# Patient Record
Sex: Female | Born: 1985
Health system: Southern US, Community
[De-identification: ages and names within clinical notes are randomized; demographics above are authoritative.]

## PROBLEM LIST (undated history)

## (undated) DIAGNOSIS — F419 Anxiety disorder, unspecified: Secondary | ICD-10-CM

## (undated) DIAGNOSIS — I10 Essential (primary) hypertension: Secondary | ICD-10-CM

## (undated) DIAGNOSIS — A64 Unspecified sexually transmitted disease: Secondary | ICD-10-CM

## (undated) DIAGNOSIS — B009 Herpesviral infection, unspecified: Secondary | ICD-10-CM

## (undated) DIAGNOSIS — F32A Depression, unspecified: Secondary | ICD-10-CM

## (undated) DIAGNOSIS — J45909 Unspecified asthma, uncomplicated: Secondary | ICD-10-CM

## (undated) DIAGNOSIS — T7840XA Allergy, unspecified, initial encounter: Secondary | ICD-10-CM

## (undated) DIAGNOSIS — N879 Dysplasia of cervix uteri, unspecified: Secondary | ICD-10-CM

## (undated) DIAGNOSIS — F329 Major depressive disorder, single episode, unspecified: Secondary | ICD-10-CM

## (undated) HISTORY — DX: Dysplasia of cervix uteri, unspecified: N87.9

## (undated) HISTORY — PX: GYNECOLOGIC CRYOSURGERY: SHX857

## (undated) HISTORY — PX: COLPOSCOPY: SHX161

## (undated) HISTORY — PX: WISDOM TOOTH EXTRACTION: SHX21

## (undated) HISTORY — DX: Essential (primary) hypertension: I10

## (undated) HISTORY — DX: Anxiety disorder, unspecified: F41.9

## (undated) HISTORY — DX: Major depressive disorder, single episode, unspecified: F32.9

## (undated) HISTORY — DX: Unspecified sexually transmitted disease: A64

## (undated) HISTORY — DX: Unspecified asthma, uncomplicated: J45.909

## (undated) HISTORY — PX: OTHER SURGICAL HISTORY: SHX169

## (undated) HISTORY — PX: CERVICAL BIOPSY: SHX590

## (undated) HISTORY — DX: Depression, unspecified: F32.A

## (undated) HISTORY — DX: Allergy, unspecified, initial encounter: T78.40XA

---

## 2016-12-05 ENCOUNTER — Ambulatory Visit (INDEPENDENT_AMBULATORY_CARE_PROVIDER_SITE_OTHER): Payer: BLUE CROSS/BLUE SHIELD | Admitting: Physician Assistant

## 2016-12-05 VITALS — BP 150/84 | HR 91 | Temp 98.0°F | Ht 62.0 in | Wt 196.6 lb

## 2016-12-05 DIAGNOSIS — R030 Elevated blood-pressure reading, without diagnosis of hypertension: Secondary | ICD-10-CM

## 2016-12-05 DIAGNOSIS — Z720 Tobacco use: Secondary | ICD-10-CM | POA: Diagnosis not present

## 2016-12-05 DIAGNOSIS — Z716 Tobacco abuse counseling: Secondary | ICD-10-CM | POA: Diagnosis not present

## 2016-12-05 DIAGNOSIS — J45909 Unspecified asthma, uncomplicated: Secondary | ICD-10-CM

## 2016-12-05 NOTE — Progress Notes (Signed)
   Alexis Donaldson  MRN: [id number] DOB: 1986/01/24  Subjective:  Alexis Donaldson is a 31 y.o. female seen in office today for a chief complaint of need of cardiopulmonary specialist. She had a DUI and needs an ignition interlock placed in her car but she cannot pass the test to start her car due to difficulty breathing out. She needs a short breath test but can only get this if she has a specialist sign off on it. She has had asthma for the past 7 years. Does QVAR 41mcg daily and proair as needed. She smokes 1-2 cigarettes a day for the past 4 years. She has been trying to quit the past two months. She is not using anything to quit.     Review of Systems  Constitutional: Negative for chills, diaphoresis and fever.  Respiratory: Negative for cough, shortness of breath and wheezing.   Cardiovascular: Negative for chest pain and palpitations.  Gastrointestinal: Negative for abdominal pain, diarrhea, nausea and vomiting.   There are no active problems to display for this patient.   No current outpatient prescriptions on file prior to visit.   No current facility-administered medications on file prior to visit.     Allergies  Allergen Reactions  . Effexor [Venlafaxine] Anxiety       Social History   Social History  . Marital status: Single    Spouse name: N/A  . Number of children: N/A  . Years of education: N/A   Occupational History  . Not on file.   Social History Main Topics  . Smoking status: Light Tobacco Smoker    Packs/day: 0.25    Years: 4.00    Types: Cigarettes  . Smokeless tobacco: Never Used  . Alcohol use 4.8 oz/week    1 Cans of beer, 4 Glasses of wine, 3 Shots of liquor per week  . Drug use: No  . Sexual activity: Not on file   Other Topics Concern  . Not on file   Social History Narrative  . No narrative on file    Objective:  BP (!) 150/84 (BP Location: Left Arm, Patient Position: Sitting, Cuff Size: Large)   Pulse 91   Temp 98 F (36.7 C) (Oral)    Ht 5\' 2"  (1.575 m)   Wt 196 lb 9.6 oz (89.2 kg)   LMP 12/02/2016 (Exact Date)   SpO2 98%   BMI 35.96 kg/m   Physical Exam  Constitutional: She is oriented to person, place, and time and well-developed, well-nourished, and in no distress.  HENT:  Head: Normocephalic and atraumatic.  Eyes: Conjunctivae are normal.  Neck: Normal range of motion.  Cardiovascular: Normal rate, regular rhythm and normal heart sounds.   Pulmonary/Chest: Effort normal and breath sounds normal.  Neurological: She is alert and oriented to person, place, and time. Gait normal.  Skin: Skin is warm and dry.  Psychiatric: Affect normal.  Vitals reviewed.   Assessment and Plan :  1. Uncomplicated asthma, unspecified asthma severity, unspecified whether persistent - Ambulatory referral to Pulmonology  2. Elevated blood pressure reading -Asymptomatic. Instructed to check bp outside of office. Goal is <140/90. Please return to office in 1 week for bp recheck, consider initiating medications at this time if still elevated.   3. Encounter for smoking cessation counseling Pt given educational material on smoking cessation. Given dosing regimen for OTC nicoderm patch.   Tenna Delaine PA-C  [facility] 12/05/2016 4:30 PM

## 2016-12-05 NOTE — Patient Instructions (Addendum)
I have put in your referral for pulmonology. They should be contacting within the next week, if you do not hear anything from them in one week call our office.   For nicoderm: Buy the 14mg  patch and apply daily for 6 weeks. Then apply 7mg  patch daily for 2 weeks. Start this on cigarette quit day.   I would like you to follow up with me in 1-2 weeks for repeat blood pressure. If you could take your bp over the next week at different times of the day and write these values down. Take it after you have been sitting down for at least 5 minutes.   How to Take Your Blood Pressure You can take your blood pressure at home with a machine. You may need to check your blood pressure at home:  To check if you have high blood pressure (hypertension).  To check your blood pressure over time.  To make sure your blood pressure medicine is working. Supplies needed: You will need a blood pressure machine, or monitor. You can buy one at a drugstore or online. When choosing one:  Choose one with an arm cuff.  Choose one that wraps around your upper arm. Only one finger should fit between your arm and the cuff.  Do not choose one that measures your blood pressure from your wrist or finger. Your doctor can suggest a monitor. How to prepare Avoid these things for 30 minutes before checking your blood pressure:  Drinking caffeine.  Drinking alcohol.  Eating.  Smoking.  Exercising. Five minutes before checking your blood pressure:  Pee.  Sit in a dining chair. Avoid sitting in a soft couch or armchair.  Be quiet. Do not talk. How to take your blood pressure Follow the instructions that came with your machine. If you have a digital blood pressure monitor, these may be the instructions: 1. Sit up straight. 2. Place your feet on the floor. Do not cross your ankles or legs. 3. Rest your left arm at the level of your heart. You may rest it on a table, desk, or chair. 4. Pull up your shirt  sleeve. 5. Wrap the blood pressure cuff around the upper part of your left arm. The cuff should be 1 inch (2.5 cm) above your elbow. It is best to wrap the cuff around bare skin. 6. Fit the cuff snugly around your arm. You should be able to place only one finger between the cuff and your arm. 7. Put the cord inside the groove of your elbow. 8. Press the power button. 9. Sit quietly while the cuff fills with air and loses air. 10. Write down the numbers on the screen. 11. Wait 2-3 minutes and then repeat steps 1-10. What do the numbers mean? Two numbers make up your blood pressure. The first number is called systolic pressure. The second is called diastolic pressure. An example of a blood pressure reading is "120 over 80" (or 120/80). If you are an adult and do not have a medical condition, use this guide to find out if your blood pressure is normal: Normal   First number: below 120.  Second number: below 80. Elevated   First number: 120-129.  Second number: below 80. Hypertension stage 1   First number: 130-139.  Second number: 80-89. Hypertension stage 2   First number: 140 or above.  Second number: 24 or above. Your blood pressure is above normal even if only the top or bottom number is above normal. Follow these  instructions at home:  Check your blood pressure as often as your doctor tells you to.  Take your monitor to your next doctor's appointment. Your doctor will:  Make sure you are using it correctly.  Make sure it is working right.  Make sure you understand what your blood pressure numbers should be.  Tell your doctor if your medicines are causing side effects. Contact a doctor if:  Your blood pressure keeps being high. Get help right away if:  Your first blood pressure number is higher than 180.  Your second blood pressure number is higher than 120. This information is not intended to replace advice given to you by your health care provider. Make sure  you discuss any questions you have with your health care provider. Document Released: 09/04/2008 Document Revised: 08/20/2016 Document Reviewed: 02/29/2016 Elsevier Interactive Patient Education  2017 [employer].   IF you received an x-ray today, you will receive an invoice from [facility]. Please contact [facility] at [phone] with questions or concerns regarding your invoice.   IF you received labwork today, you will receive an invoice from [facility]. Please contact [facility] at [phone] with questions or concerns regarding your invoice.   Our billing staff will not be able to assist you with questions regarding bills from these companies.  You will be contacted with the lab results as soon as they are available. The fastest way to get your results is to activate your My Chart account. Instructions are located on the last page of this paperwork. If you have not heard from [place] regarding the results in 2 weeks, please contact this office.

## 2016-12-12 ENCOUNTER — Ambulatory Visit (INDEPENDENT_AMBULATORY_CARE_PROVIDER_SITE_OTHER): Payer: BLUE CROSS/BLUE SHIELD | Admitting: Physician Assistant

## 2016-12-12 VITALS — BP 150/90 | HR 84 | Temp 98.3°F | Resp 16 | Ht 62.0 in | Wt 196.0 lb

## 2016-12-12 DIAGNOSIS — R8299 Other abnormal findings in urine: Secondary | ICD-10-CM

## 2016-12-12 DIAGNOSIS — I10 Essential (primary) hypertension: Secondary | ICD-10-CM | POA: Diagnosis not present

## 2016-12-12 DIAGNOSIS — Z113 Encounter for screening for infections with a predominantly sexual mode of transmission: Secondary | ICD-10-CM | POA: Diagnosis not present

## 2016-12-12 DIAGNOSIS — R82998 Other abnormal findings in urine: Secondary | ICD-10-CM

## 2016-12-12 LAB — POCT URINALYSIS DIP (MANUAL ENTRY)
BILIRUBIN UA: NEGATIVE
Bilirubin, UA: NEGATIVE
Blood, UA: NEGATIVE
GLUCOSE UA: NEGATIVE
Nitrite, UA: NEGATIVE
Spec Grav, UA: 1.015
Urobilinogen, UA: 1
pH, UA: 6.5

## 2016-12-12 LAB — POC MICROSCOPIC URINALYSIS (UMFC): Mucus: ABSENT

## 2016-12-12 MED ORDER — BLOOD PRESSURE KIT DEVI: 1.0000 [IU] | Freq: Every day | 0 refills | Status: AC

## 2016-12-12 MED ORDER — AMLODIPINE BESYLATE 5 MG PO TABS: 5.0000 mg | ORAL_TABLET | Freq: Every day | ORAL | 0 refills | Status: DC

## 2016-12-12 NOTE — Progress Notes (Signed)
MRN: [id number] DOB: 1986-05-03  Subjective:   Alexis Donaldson is a 31 y.o. female presenting for follow up on high blood pressure readings. Was followed [facility] [place] for the past two years for consistent elevated bp readings. She never followed up for medication initiation. She used to check in during this time out of the office and it was run anywhere from [id number] systoilic.   I saw her last week for a referrral visit ant iwas elevated told to return in on eweek for bp repeat. she has not checked it outside the office. Reports no symptoms. Denies lightheadedness, dizziness, chronic headache, double vision, chest pain, shortness of breath, heart racing, palpitations, nausea, vomiting, abdominal pain, hematuria, lower leg swelling. Recently stopped smoking. She has not smoked in one week. Prior to this she smoked 1/4 pack ppd x 4 years.   Diet: [place], chicken, fish, some beef,. Does like chips and sunflower seeds but working on cutting these out. Does not cook with a lot of extra salt. She loves fruits and vegetables. Does drink juice but is trying to up her water intake.   Exercise: Her exercise has decreased over the past few months she used to walk 2 miles daily.   FH of HTN in mother and father. Father had MI at age 10 and 20 yo.   She also wants STD testing today. Not having symptoms but just recently switched sexual partners. She does not use condoms. Has IUD in place, does not have periods.   Alexis Donaldson has a current medication list which includes the following prescription(s): albuterol, beclomethasone, cetirizine, fluticasone, ibuprofen, amlodipine, and blood pressure kit. Also is allergic to effexor [venlafaxine].  Alexis Donaldson  has a past medical history of Allergy; Anxiety; Asthma; and Depression. Also  has no past surgical history on file.   Objective:   Vitals: BP (!) 150/90   Pulse 84   Temp 98.3 F (36.8 C) (Oral)   Resp 16   Ht '5\' 2"'  (1.575 m)   Wt 196 lb (88.9 kg)   LMP  12/02/2016 (Exact Date)   SpO2 98%   BMI 35.85 kg/m   Physical Exam  Constitutional: She is oriented to person, place, and time. She appears well-developed and well-nourished.  HENT:  Head: Normocephalic and atraumatic.  Eyes: Conjunctivae are normal.  Neck: Normal range of motion.  Cardiovascular: Normal rate, regular rhythm, normal heart sounds and intact distal pulses.   Pulmonary/Chest: Effort normal and breath sounds normal.  Abdominal: Soft. Bowel sounds are normal. There is no tenderness.  Neurological: She is alert and oriented to person, place, and time.  Skin: Skin is warm and dry.  Psychiatric: She has a normal mood and affect.  Vitals reviewed.   BP Readings from Last 3 Encounters:  12/12/16 (!) 150/90  12/05/16 (!) 150/84    Results for orders placed or performed in visit on 12/12/16 (from the past 24 hour(s))  POCT urinalysis dipstick     Status: Abnormal   Collection Time: 12/12/16 10:28 AM  Result Value Ref Range   Color, UA yellow yellow   Clarity, UA clear clear   Glucose, UA negative negative   Bilirubin, UA negative negative   Ketones, POC UA negative negative   Spec Grav, UA 1.015    Blood, UA negative negative   pH, UA 6.5    Protein Ur, POC =30 (A) negative   Urobilinogen, UA 1.0    Nitrite, UA Negative Negative   Leukocytes, UA Trace (A) Negative  POCT Microscopic Urinalysis ([facility])     Status: Abnormal   Collection Time: 12/12/16 10:29 AM  Result Value Ref Range   WBC,UR,HPF,POC Few (A) None WBC/hpf   RBC,UR,HPF,POC None None RBC/hpf   Bacteria Moderate (A) None, Too numerous to count   Mucus Absent Absent   Epithelial Cells, UR Per Microscopy Moderate (A) None, Too numerous to count cells/hpf     EKG shows sinus rhythm at 92 bpm. There is nonspecific T wave flattening in lead III and nonspecific T wave inversion in lead V1. No acute ST or T wave changes. Normal axis. No prior EKG for comparison.   Assessment and Plan :  1. Essential  hypertension Pt instructed to return for follow up in 4-6 weeks for bp recheck. Monitor outside office, goal is <140/90. - CBC with Differential/Platelet - CMP14+EGFR - Lipid panel - TSH - POCT urinalysis dipstick - POCT Microscopic Urinalysis ([facility]) - EKG 12-Lead - amLODipine (NORVASC) 5 MG tablet; Take 1 tablet (5 mg total) by mouth daily.  Dispense: 90 tablet; Refill: 0 - Blood Pressure Monitoring (BLOOD PRESSURE KIT) DEVI; 1 Units by Does not apply route daily.  Dispense: 1 Device; Refill: 0  2. Screen for STD (sexually transmitted disease) - GC/Chlamydia Probe Amp - HIV antibody - Hepatitis C antibody - RPR - Trichomonas vaginalis, RNA  3. Leukocytes in urine -Asymptomatic.  - Urine culture  Tenna Delaine, PA-C  [facility] 12/12/2016 12:26 PM

## 2016-12-12 NOTE — Patient Instructions (Addendum)
For high blood pressure, I want you to start taking amlodipine 5mg  daily. I have given you a prescription for a blood pressure monitoring cuff. Please check your bp every few days. Your goal is <140/90 and >100/60. Follow up in 4-6 weeks for bp recheck. Bring your readings to this visit. I will contact you with your lab results in a few days.   You can make your follow up appointment today when you leave.   Thank you for letting me participate in your health and well being.    Hypertension Hypertension is another name for high blood pressure. High blood pressure forces your heart to work harder to pump blood. This can cause problems over time. There are two numbers in a blood pressure reading. There is a top number (systolic) over a bottom number (diastolic). It is best to have a blood pressure below 120/80. Healthy choices can help lower your blood pressure. You may need medicine to help lower your blood pressure if:  Your blood pressure cannot be lowered with healthy choices.  Your blood pressure is higher than 130/80. Follow these instructions at home: Eating and drinking   If directed, follow the DASH eating plan. This diet includes:  Filling half of your plate at each meal with fruits and vegetables.  Filling one quarter of your plate at each meal with whole grains. Whole grains include whole wheat pasta, brown rice, and whole grain bread.  Eating or drinking low-fat dairy products, such as skim milk or low-fat yogurt.  Filling one quarter of your plate at each meal with low-fat (lean) proteins. Low-fat proteins include fish, skinless chicken, eggs, beans, and tofu.  Avoiding fatty meat, cured and processed meat, or chicken with skin.  Avoiding premade or processed food.  Eat less than 1,500 mg of salt (sodium) a day.  Limit alcohol use to no more than 1 drink a day for nonpregnant women and 2 drinks a day for men. One drink equals 12 oz of beer, 5 oz of wine, or 1 oz of hard  liquor. Lifestyle   Work with your doctor to stay at a healthy weight or to lose weight. Ask your doctor what the best weight is for you.  Get at least 30 minutes of exercise that causes your heart to beat faster (aerobic exercise) most days of the week. This may include walking, swimming, or biking.  Get at least 30 minutes of exercise that strengthens your muscles (resistance exercise) at least 3 days a week. This may include lifting weights or pilates.  Do not use any products that contain nicotine or tobacco. This includes cigarettes and e-cigarettes. If you need help quitting, ask your doctor.  Check your blood pressure at home as told by your doctor.  Keep all follow-up visits as told by your doctor. This is important. Medicines   Take over-the-counter and prescription medicines only as told by your doctor. Follow directions carefully.  Do not skip doses of blood pressure medicine. The medicine does not work as well if you skip doses. Skipping doses also puts you at risk for problems.  Ask your doctor about side effects or reactions to medicines that you should watch for. Contact a doctor if:  You think you are having a reaction to the medicine you are taking.  You have headaches that keep coming back (recurring).  You feel dizzy.  You have swelling in your ankles.  You have trouble with your vision. Get help right away if:  You get  a very bad headache.  You start to feel confused.  You feel weak or numb.  You feel faint.  You get very bad pain in your:  Chest.  Belly (abdomen).  You throw up (vomit) more than once.  You have trouble breathing. Summary  Hypertension is another name for high blood pressure.  Making healthy choices can help lower blood pressure. If your blood pressure cannot be controlled with healthy choices, you may need to take medicine. This information is not intended to replace advice given to you by your health care provider. Make  sure you discuss any questions you have with your health care provider. Document Released: 03/10/2008 Document Revised: 08/20/2016 Document Reviewed: 08/20/2016 Elsevier Interactive Patient Education  2017 [employer].     IF you received an x-ray today, you will receive an invoice from [facility]. Please contact [facility] at [phone] with questions or concerns regarding your invoice.   IF you received labwork today, you will receive an invoice from [facility]. Please contact [facility] at [phone] with questions or concerns regarding your invoice.   Our billing staff will not be able to assist you with questions regarding bills from these companies.  You will be contacted with the lab results as soon as they are available. The fastest way to get your results is to activate your My Chart account. Instructions are located on the last page of this paperwork. If you have not heard from [place] regarding the results in 2 weeks, please contact this office.Pt

## 2016-12-13 LAB — HIV ANTIBODY (ROUTINE TESTING W REFLEX): HIV SCREEN 4TH GENERATION: NONREACTIVE

## 2016-12-13 LAB — CBC WITH DIFFERENTIAL/PLATELET
Basophils Absolute: 0 10*3/uL (ref 0.0–0.2)
Basos: 0 %
EOS (ABSOLUTE): 0.1 10*3/uL (ref 0.0–0.4)
EOS: 1 %
HEMATOCRIT: 38.8 % (ref 34.0–46.6)
HEMOGLOBIN: 13.2 g/dL (ref 11.1–15.9)
Immature Grans (Abs): 0 10*3/uL (ref 0.0–0.1)
Immature Granulocytes: 0 %
LYMPHS ABS: 1.8 10*3/uL (ref 0.7–3.1)
Lymphs: 38 %
MCH: 28.4 pg (ref 26.6–33.0)
MCHC: 34 g/dL (ref 31.5–35.7)
MCV: 83 fL (ref 79–97)
MONOCYTES: 10 %
Monocytes Absolute: 0.5 10*3/uL (ref 0.1–0.9)
NEUTROS ABS: 2.4 10*3/uL (ref 1.4–7.0)
Neutrophils: 51 %
Platelets: 311 10*3/uL (ref 150–379)
RBC: 4.65 x10E6/uL (ref 3.77–5.28)
RDW: 13.4 % (ref 12.3–15.4)
WBC: 4.9 10*3/uL (ref 3.4–10.8)

## 2016-12-13 LAB — CMP14+EGFR
ALBUMIN: 4.5 g/dL (ref 3.5–5.5)
ALK PHOS: 78 IU/L (ref 39–117)
ALT: 28 IU/L (ref 0–32)
AST: 28 IU/L (ref 0–40)
Albumin/Globulin Ratio: 1.3 (ref 1.2–2.2)
BILIRUBIN TOTAL: 0.3 mg/dL (ref 0.0–1.2)
BUN/Creatinine Ratio: 4 — ABNORMAL LOW (ref 9–23)
BUN: 3 mg/dL — ABNORMAL LOW (ref 6–20)
CHLORIDE: 101 mmol/L (ref 96–106)
CO2: 21 mmol/L (ref 18–29)
CREATININE: 0.75 mg/dL (ref 0.57–1.00)
Calcium: 9.6 mg/dL (ref 8.7–10.2)
GFR calc Af Amer: 124 mL/min/{1.73_m2} (ref >59)
GFR calc non Af Amer: 107 mL/min/{1.73_m2} (ref >59)
GLOBULIN, TOTAL: 3.5 g/dL (ref 1.5–4.5)
Glucose: 93 mg/dL (ref 65–99)
POTASSIUM: 4 mmol/L (ref 3.5–5.2)
SODIUM: 143 mmol/L (ref 134–144)
Total Protein: 8 g/dL (ref 6.0–8.5)

## 2016-12-13 LAB — LIPID PANEL
CHOLESTEROL TOTAL: 226 mg/dL — AB (ref 100–199)
Chol/HDL Ratio: 6.3 ratio units — ABNORMAL HIGH (ref 0.0–4.4)
HDL: 36 mg/dL — ABNORMAL LOW (ref >39)
LDL CALC: 163 mg/dL — AB (ref 0–99)
Triglycerides: 133 mg/dL (ref 0–149)
VLDL Cholesterol Cal: 27 mg/dL (ref 5–40)

## 2016-12-13 LAB — RPR: RPR Ser Ql: NONREACTIVE

## 2016-12-13 LAB — TSH: TSH: 1.2 u[IU]/mL (ref 0.450–4.500)

## 2016-12-13 LAB — HEPATITIS C ANTIBODY: Hep C Virus Ab: <0.1 s/co ratio (ref 0.0–0.9)

## 2016-12-14 LAB — GC/CHLAMYDIA PROBE AMP
Chlamydia trachomatis, NAA: NEGATIVE
Neisseria gonorrhoeae by PCR: NEGATIVE

## 2016-12-14 LAB — TRICHOMONAS VAGINALIS, PROBE AMP: Trich vag by NAA: NEGATIVE

## 2016-12-14 LAB — URINE CULTURE: Organism ID, Bacteria: NO GROWTH

## 2016-12-29 ENCOUNTER — Ambulatory Visit (INDEPENDENT_AMBULATORY_CARE_PROVIDER_SITE_OTHER): Payer: BLUE CROSS/BLUE SHIELD | Admitting: Pulmonary Disease

## 2016-12-29 ENCOUNTER — Encounter: Payer: Self-pay | Admitting: Pulmonary Disease

## 2016-12-29 VITALS — BP 128/74 | HR 101 | Ht 62.0 in | Wt 192.0 lb

## 2016-12-29 DIAGNOSIS — R0602 Shortness of breath: Secondary | ICD-10-CM

## 2016-12-29 NOTE — Progress Notes (Signed)
Alexis Donaldson    [id number]    12/04/1985  Primary Care Physician:Brittany D Ellery Plunk  Referring Physician: Leonie Douglas, PA-C [place], [place] [address]  Chief complaint:  Consult for evaluation of asthma Requires spirometry for ignition interlock medical accommodation  HPI: 31 year old with past medical history of mild intermittent asthma. She has been maintained on Qvar and albuterol. She does not have daily symptoms and use the Qvar only intermittently. Her last use was over a month ago. She does not need to use albuterol inhaler at all. She has an ignition interlock system for a DUI and requires spirometry as she isn't able to blow to unlock the system.  Outpatient Encounter Prescriptions as of 12/29/2016  Medication Sig  . albuterol (PROVENTIL) (2.5 MG/3ML) 0.083% nebulizer solution Take 2.5 mg by nebulization every 6 (six) hours as needed for wheezing or shortness of breath.  Marland Kitchen amLODipine (NORVASC) 5 MG tablet Take 1 tablet (5 mg total) by mouth daily.  . Blood Pressure Monitoring (BLOOD PRESSURE KIT) DEVI 1 Units by Does not apply route daily.  Marland Kitchen ibuprofen (ADVIL,MOTRIN) 800 MG tablet Take 800 mg by mouth every 6 (six) hours as needed (as needed for pain).  Marland Kitchen levonorgestrel (MIRENA) 20 MCG/24HR IUD by Intrauterine route.  . beclomethasone (QVAR) 40 MCG/ACT inhaler Inhale into the lungs 2 (two) times daily.  . cetirizine (ZYRTEC) 10 MG tablet Take 10 mg by mouth daily.  . fluticasone (FLONASE) 50 MCG/ACT nasal spray Place into both nostrils daily.   No facility-administered encounter medications on file as of 12/29/2016.     Allergies as of 12/29/2016 - Review Complete 12/29/2016  Allergen Reaction Noted  . Effexor [venlafaxine] Anxiety 12/05/2016    Past Medical History:  Diagnosis Date  . Allergy   . Anxiety   . Asthma   . Depression     History reviewed. No pertinent surgical history.  Family History  Problem Relation Age of Onset   . Hyperlipidemia Mother   . Hypertension Mother   . Hypertension Father   . Heart attack Father   . Mental illness Sister   . Diabetes Maternal Grandmother   . Cancer Maternal Grandmother   . Cancer Maternal Grandfather   . Hypertension Maternal Grandfather   . Heart disease Maternal Grandfather   . Mental illness Paternal Grandmother   . Hypertension Paternal Grandfather   . Stroke Paternal Grandfather   . Mental illness Sister     Social History   Social History  . Marital status: Single    Spouse name: N/A  . Number of children: N/A  . Years of education: N/A   Occupational History  . Not on file.   Social History Main Topics  . Smoking status: Former Smoker    Packs/day: 0.25    Years: 4.00    Types: Cigarettes    Quit date: 12/04/2016  . Smokeless tobacco: Never Used  . Alcohol use 4.8 oz/week    4 Glasses of wine, 1 Cans of beer, 3 Shots of liquor per week  . Drug use: No  . Sexual activity: Not on file   Other Topics Concern  . Not on file   Social History Narrative  . No narrative on file    Review of systems: Review of Systems  Constitutional: Negative for fever and chills.  HENT: Negative.   Eyes: Negative for blurred vision.  Respiratory: as per HPI  Cardiovascular: Negative for chest pain and  palpitations.  Gastrointestinal: Negative for vomiting, diarrhea, blood per rectum. Genitourinary: Negative for dysuria, urgency, frequency and hematuria.  Musculoskeletal: Negative for myalgias, back pain and joint pain.  Skin: Negative for itching and rash.  Neurological: Negative for dizziness, tremors, focal weakness, seizures and loss of consciousness.  Endo/Heme/Allergies: Negative for environmental allergies.  Psychiatric/Behavioral: Negative for depression, suicidal ideas and hallucinations.  All other systems reviewed and are negative.  Physical Exam: Blood pressure 128/74, pulse (!) 101, height '5\' 2"'  (1.575 m), weight 192 lb (87.1 kg), last  menstrual period 12/02/2016, SpO2 98 %. Gen:      No acute distress HEENT:  EOMI, sclera anicteric Neck:     No masses; no thyromegaly Lungs:    Clear to auscultation bilaterally; normal respiratory effort CV:         Regular rate and rhythm; no murmurs Abd:      + bowel sounds; soft, non-tender; no palpable masses, no distension Ext:    No edema; adequate peripheral perfusion Skin:      Warm and dry; no rash Neuro: alert and oriented x 3 Psych: normal mood and affect  Data Reviewed: FENO 12/30/15- 9  PFTs 12/29/16 FVC 2.98 [100%) FEV1 2.54 (99%) F/F 85 Normal spirometry.  CBC    Component Value Date/Time   WBC 4.9 12/12/2016 0952   RBC 4.65 12/12/2016 0952   HCT 38.8 12/12/2016 0952   PLT 311 12/12/2016 0952   MCV 83 12/12/2016 0952   MCH 28.4 12/12/2016 0952   MCHC 34.0 12/12/2016 0952   RDW 13.4 12/12/2016 0952   LYMPHSABS 1.8 12/12/2016 0952   EOSABS 0.1 12/12/2016 0952   BASOSABS 0.0 12/12/2016 0952   Assessment:  Mild intermittent asthma She is using her q var inhaler as needed and does not need the albuterol inhaler at all. As her symptoms are minimal she can follow with her PCP for further management.  Spirometry reviewed which shows normal lung function. I have filled the DMV forms (scanned into media folder) and enclosed a copy of the spirometry.  Plan/Recommendations: - Return as needed - DMV paperwork filled.   Marshell Garfinkel MD [facility] Pager [phone] 12/29/2016, 4:43 PM  CC: Tenna Delaine D, PA*

## 2016-12-29 NOTE — Patient Instructions (Signed)
Your pulmonary function tests were reviewed today. We have signed and returned the paperwork You can follow up with primary care for further management of asthma. Return to clinic as needed.

## 2017-01-13 ENCOUNTER — Institutional Professional Consult (permissible substitution): Payer: Self-pay | Admitting: Pulmonary Disease

## 2017-01-14 ENCOUNTER — Encounter: Payer: Self-pay | Admitting: Physician Assistant

## 2017-01-14 ENCOUNTER — Ambulatory Visit (INDEPENDENT_AMBULATORY_CARE_PROVIDER_SITE_OTHER): Payer: BLUE CROSS/BLUE SHIELD | Admitting: Physician Assistant

## 2017-01-14 VITALS — BP 128/81 | HR 94 | Temp 99.0°F | Resp 16 | Ht 62.0 in | Wt 197.0 lb

## 2017-01-14 DIAGNOSIS — F411 Generalized anxiety disorder: Secondary | ICD-10-CM

## 2017-01-14 DIAGNOSIS — I10 Essential (primary) hypertension: Secondary | ICD-10-CM

## 2017-01-14 MED ORDER — ALBUTEROL SULFATE HFA 108 (90 BASE) MCG/ACT IN AERS: 2.0000 | INHALATION_SPRAY | RESPIRATORY_TRACT | 1 refills | Status: DC | PRN

## 2017-01-14 MED ORDER — AMLODIPINE BESYLATE 5 MG PO TABS: 5.0000 mg | ORAL_TABLET | Freq: Every day | ORAL | 0 refills | Status: DC

## 2017-01-14 MED ORDER — HYDROXYZINE HCL 25 MG PO TABS: 25.0000 mg | ORAL_TABLET | Freq: Three times a day (TID) | ORAL | 2 refills | Status: DC | PRN

## 2017-01-14 MED ORDER — IBUPROFEN 800 MG PO TABS: 800.0000 mg | ORAL_TABLET | Freq: Four times a day (QID) | ORAL | 0 refills | Status: DC | PRN

## 2017-01-14 NOTE — Progress Notes (Signed)
MRN: [id number] DOB: 01-09-1986  Subjective:   Alexis Donaldson is a 31 y.o. female presenting for follow up on Hypertension. Was started on amlodipine 32m at our last visit on 12/12/16.   Patient is checking blood pressure at home, range is 1941-740Csystolic. Reports she has had some mild swelling in her hands since she started the medication. Denies lightheadedness, dizziness, chronic headache, double vision, chest pain, shortness of breath, heart racing, palpitations, nausea, vomiting, abdominal pain, hematuria, lower leg swelling.   She would also like to talk about anxiety. She has tried zoloft and effexor in the past. Effexor caused terrible insomnia. Zoloft caused visual disturbances and heart palpitations. She has struggled with anxiety for the past 2 years. It is worsened with work and random things that she cannot pinpoint. 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 Her "bad days" consist of her waking up from sleep in sweats with nausea and tachycardia. After about an hour she will feel like she is back to normal. She will also have panick attacks at work when she has a lot to do, she will start feeling nauseated, increased heart rate, and shivers. Notes these attacks occur about once a week. She has tried her sister's hydroxyzine, which has helped. She has also tried tramadol and xanax, which have also helped. She was also dx with depression 2 years ago but notes that this is not her biggest issue. She deals with this with a sun lamp at home and exercise.   She also needs refills for her PRN ibuprofen and albuterol inhaler.   KDustinahas a current medication list which includes the following prescription(s): albuterol, amlodipine, beclomethasone, cetirizine, fluticasone, ibuprofen, blood pressure kit, and levonorgestrel. Also is allergic to effexor [venlafaxine] and zoloft [sertraline hcl].  KYanissa has a past medical history of Allergy; Anxiety;  Asthma; and Depression. Also  has no past surgical history on file.   Objective:   Vitals: BP 128/81 (BP Location: Right Arm, Patient Position: Sitting, Cuff Size: Normal)   Pulse 94   Temp 99 F (37.2 C) (Oral)   Resp 16   Ht 5' 2" (1.575 m)   Wt 197 lb (89.4 kg)   SpO2 97%   BMI 36.03 kg/m   Physical Exam  Constitutional: She is oriented to person, place, and time. She appears well-developed and well-nourished.  HENT:  Head: Normocephalic and atraumatic.  Eyes: Conjunctivae are normal.  Neck: Normal range of motion.  Cardiovascular: Normal rate, regular rhythm, normal heart sounds and intact distal pulses.   Pulmonary/Chest: Effort normal and breath sounds normal.  Musculoskeletal:       Right lower leg: She exhibits no swelling.       Left lower leg: She exhibits no swelling.  Neurological: She is alert and oriented to person, place, and time.  Skin: Skin is warm and dry.  Psychiatric: She has a normal mood and affect.  Vitals reviewed.   BP Readings from Last 3 Encounters:  01/14/17 128/81  12/29/16 128/74  12/12/16 (!) 150/90   No results found for this or any previous visit (from the past 24 hour(s)).  Assessment and Plan :  1. Essential hypertension Well controlled in office. Continue medications as prescribed. Discussed healthy lifestyle modifications to help lower bp. She has also quit smoking since our first visit, which I have congratulated her on and encouraged her to continue avoiding smoking. Instructed to return for reevaluation in 3 months.  -  amLODipine (NORVASC) 5 MG tablet; Take 1 tablet (5 mg total) by mouth daily.  Dispense: 90 tablet; Refill: 0  2. Anxiety state Pt does not want to start a long term medication at this point, she prefers using a short term PRN med as these attacks occur about once a week. Instructed to use vistaril PRN for panic attacks. Will reevaluate in 3 months at follow up visit.  - hydrOXYzine (ATARAX/VISTARIL) 25 MG tablet;  Take 1-2 tablets (25-50 mg total) by mouth 3 (three) times daily as needed.  Dispense: 30 tablet; Refill: Adeline, PA-C  [facility] 01/14/2017 2:31 PM

## 2017-01-14 NOTE — Patient Instructions (Addendum)
  For blood pressure, continue taking amlodipine daily. You are doing a great job and I am really proud of you. Keep up the good work in terms of exercising and dietary changes as well!   In terms of anxiety, I have prescribed hydroxyzine. You can take 1-2 tablets as needed up to three times a day when you are having an attack. Please use as soon as the anxiety comes about. This medication may make you drowsy so if it does and you need it during the daytime, try taking 1/2 tablet to decrease drowsiness.  Please follow up in 3 months or sooner if you have any questions!  Thank you for letting me participate in your health and well being.    IF you received an x-ray today, you will receive an invoice from [facility]. Please contact [facility] at [phone] with questions or concerns regarding your invoice.   IF you received labwork today, you will receive an invoice from [facility]. Please contact [facility] at [phone] with questions or concerns regarding your invoice.   Our billing staff will not be able to assist you with questions regarding bills from these companies.  You will be contacted with the lab results as soon as they are available. The fastest way to get your results is to activate your My Chart account. Instructions are located on the last page of this paperwork. If you have not heard from [place] regarding the results in 2 weeks, please contact this office.

## 2017-01-15 ENCOUNTER — Encounter: Payer: Self-pay | Admitting: Physician Assistant

## 2017-01-19 ENCOUNTER — Ambulatory Visit (INDEPENDENT_AMBULATORY_CARE_PROVIDER_SITE_OTHER): Payer: BLUE CROSS/BLUE SHIELD | Admitting: Internal Medicine

## 2017-01-19 ENCOUNTER — Encounter: Payer: Self-pay | Admitting: Internal Medicine

## 2017-01-19 DIAGNOSIS — J4530 Mild persistent asthma, uncomplicated: Secondary | ICD-10-CM | POA: Diagnosis not present

## 2017-01-19 DIAGNOSIS — Z9189 Other specified personal risk factors, not elsewhere classified: Secondary | ICD-10-CM

## 2017-01-19 DIAGNOSIS — Z8709 Personal history of other diseases of the respiratory system: Secondary | ICD-10-CM | POA: Diagnosis not present

## 2017-01-19 NOTE — Assessment & Plan Note (Signed)
This is well controlled based on symptoms and breathing test  Plan Continue qvar daily Use albuterol as needed Monitor for flareups FLu shot in fall   followup Return as needed PCP Leonie Douglas, PA-C should be able to handle your asthma but you can come back here if needed

## 2017-01-19 NOTE — Progress Notes (Signed)
Subjective:    Patient ID: Alexis Donaldson, female    DOB: [birth year], 31 y.o.   MRN: [id number]  HPI   OV 01/19/2017  Chief Complaint  Patient presents with  . Pulmonary Consult    Pt self referred for SOB. Pt states her SOB is worse with seasonal allergies. Pt c/o intermitent chest tightness d/t pollen. Pt denies cough and f/c/s.      Second opinion for mild intermittent asthma and driving safety issue  She has mild persistent asthma for which she is on Qvar. This was diagnosed outside for a few to several years. She was asthma as stable on Qvar for which she is compliant. On asthma control question and she does not wake up in the middle of the night because of asthma when she wakes up she is very mild symptoms. She is only very slightly limited because of asthma activities. She does experience a little shortness of breath because of asthma is a little of the time and uses albuterol for rescue 1 puff as needed on most days. The asthma control question average score is 1.2. A few weeks ago she did see me or my colleagues for the asthma and her spirometry at that time and nitric oxide at the time are normal suggesting that she has got good lung function in the asthma is under excellent control.  According to her history she had a DUI in August 2016 and her cough is now being fitted with a device interlock device. However she denies unable to blow into this and therefore she saw my colleague Dr. Vaughan Browner and was cleared to use the device because lung function was normal. She's here for second opinion which she says that  DMVV requires. Spirometry today is normal    has a past medical history of Allergy; Anxiety; Asthma; Depression; and Hypertension.   reports that she quit smoking about 6 weeks ago. Her smoking use included Cigarettes. She has a 1.00 pack-year smoking history. She has never used smokeless tobacco.  Past Surgical History:  Procedure Laterality Date  . CERVICAL BIOPSY    .  WISDOM TOOTH EXTRACTION      Allergies  Allergen Reactions  . Effexor [Venlafaxine] Anxiety  . Zoloft [Sertraline Hcl] Other (See Comments)    Immunization History  Administered Date(s) Administered  . HPV Quadrivalent 08/27/2007  . Tdap 04/02/2009    Family History  Problem Relation Age of Onset  . Hyperlipidemia Mother   . Hypertension Mother   . Hypertension Father   . Heart attack Father   . Mental illness Sister   . Diabetes Maternal Grandmother   . Cancer Maternal Grandmother   . Cancer Maternal Grandfather   . Hypertension Maternal Grandfather   . Heart disease Maternal Grandfather   . Mental illness Paternal Grandmother   . Hypertension Paternal Grandfather   . Stroke Paternal Grandfather   . Mental illness Sister      Current Outpatient Prescriptions:  .  albuterol (PROVENTIL HFA;VENTOLIN HFA) 108 (90 Base) MCG/ACT inhaler, Inhale 2 puffs into the lungs every 4 (four) hours as needed for wheezing or shortness of breath (cough, shortness of breath or wheezing.)., Disp: 1 Inhaler, Rfl: 1 .  amLODipine (NORVASC) 5 MG tablet, Take 1 tablet (5 mg total) by mouth daily., Disp: 90 tablet, Rfl: 0 .  beclomethasone (QVAR) 40 MCG/ACT inhaler, Inhale into the lungs 2 (two) times daily., Disp: , Rfl:  .  Blood Pressure Monitoring (BLOOD PRESSURE KIT) DEVI, 1 Units by  Does not apply route daily., Disp: 1 Device, Rfl: 0 .  cetirizine (ZYRTEC) 10 MG tablet, Take 10 mg by mouth daily., Disp: , Rfl:  .  fluticasone (FLONASE) 50 MCG/ACT nasal spray, Place into both nostrils daily., Disp: , Rfl:  .  hydrOXYzine (ATARAX/VISTARIL) 25 MG tablet, Take 1-2 tablets (25-50 mg total) by mouth 3 (three) times daily as needed., Disp: 30 tablet, Rfl: 2 .  ibuprofen (ADVIL,MOTRIN) 800 MG tablet, Take 1 tablet (800 mg total) by mouth every 6 (six) hours as needed (as needed for pain)., Disp: 30 tablet, Rfl: 0 .  levonorgestrel (MIRENA) 20 MCG/24HR IUD, by Intrauterine route., Disp: , Rfl:      Review of Systems  Constitutional: Negative for fever and unexpected weight change.  HENT: Negative for congestion, dental problem, ear pain, nosebleeds, postnasal drip, rhinorrhea, sinus pressure, sneezing, sore throat and trouble swallowing.   Eyes: Negative for redness and itching.  Respiratory: Positive for chest tightness. Negative for cough, shortness of breath and wheezing.   Cardiovascular: Negative for palpitations and leg swelling.  Gastrointestinal: Negative for nausea and vomiting.  Genitourinary: Negative for dysuria.  Musculoskeletal: Negative for joint swelling.  Skin: Negative for rash.  Neurological: Negative for headaches.  Hematological: Does not bruise/bleed easily.  Psychiatric/Behavioral: Negative for dysphoric mood. The patient is not nervous/anxious.        Objective:   Physical Exam  Constitutional: She is oriented to person, place, and time. She appears well-developed and well-nourished. No distress.  HENT:  Head: Normocephalic and atraumatic.  Right Ear: External ear normal.  Left Ear: External ear normal.  Mouth/Throat: Oropharynx is clear and moist. No oropharyngeal exudate.  Eyes: Conjunctivae and EOM are normal. Pupils are equal, round, and reactive to light. Right eye exhibits no discharge. Left eye exhibits no discharge. No scleral icterus.  Neck: Normal range of motion. Neck supple. No JVD present. No tracheal deviation present. No thyromegaly present.  Cardiovascular: Normal rate, regular rhythm, normal heart sounds and intact distal pulses.  Exam reveals no gallop and no friction rub.   No murmur heard. Pulmonary/Chest: Effort normal and breath sounds normal. No respiratory distress. She has no wheezes. She has no rales. She exhibits no tenderness.  Abdominal: Soft. Bowel sounds are normal. She exhibits no distension and no mass. There is no tenderness. There is no rebound and no guarding.  Musculoskeletal: Normal range of motion. She exhibits  no edema or tenderness.  Lymphadenopathy:    She has no cervical adenopathy.  Neurological: She is alert and oriented to person, place, and time. She has normal reflexes. No cranial nerve deficit. She exhibits normal muscle tone. Coordination normal.  Skin: Skin is warm and dry. No rash noted. She is not diaphoretic. No erythema. No pallor.  Psychiatric: She has a normal mood and affect. Her behavior is normal. Judgment and thought content normal.  Vitals reviewed.   Vitals:   01/19/17 1447  BP: (!) 136/98  Pulse: 91  SpO2: 98%  Weight: 199 lb 6.4 oz (90.4 kg)  Height: _0  (1.575 m)   Estimated body mass index is 36.47 kg/m as calculated from the following:   Height as of this encounter: _1  (1.575 m).   Weight as of this encounter: 199 lb 6.4 oz (90.4 kg).       Assessment & Plan:     ICD-9-CM ICD-10-CM   1. History of asthma V12.69 Z87.09 Spirometry with Graph  2. Driving safety issue V15.89 Z91.89   3. Mild  persistent asthma without complication 334.35 W86.16     Driving safety issue Based on symptms and breathing test, I think you can blow into the inter-lock device  Mild persistent asthma This is well controlled based on symptoms and breathing test  Plan Continue qvar daily Use albuterol as needed Monitor for flareups FLu shot in fall   followup Return as needed PCP Leonie Douglas, PA-C should be able to handle your asthma but you can come back here if needed   Dr. Brand Males, M.D., [facility].C.P Pulmonary and Critical Care Medicine Staff Physician [facility] Pager: [phone], If no answer or between  15:00h - 7:00h: call [phone]  01/19/2017 3:21 PM

## 2017-01-19 NOTE — Patient Instructions (Signed)
Driving safety issue Based on symptms and breathing test, I think you can blow into the inter-lock device  Mild persistent asthma This is well controlled based on symptoms and breathing test  Plan Continue qvar daily Use albuterol as needed Monitor for flareups FLu shot in fall   followup Return as needed PCP Leonie Douglas, PA-C should be able to handle your asthma but you can come back here if needed

## 2017-01-19 NOTE — Assessment & Plan Note (Signed)
Based on symptms and breathing test, I think you can blow into the inter-lock device

## 2017-04-15 ENCOUNTER — Ambulatory Visit (INDEPENDENT_AMBULATORY_CARE_PROVIDER_SITE_OTHER): Payer: Managed Care, Other (non HMO) | Admitting: Physician Assistant

## 2017-04-15 ENCOUNTER — Encounter: Payer: Self-pay | Admitting: Physician Assistant

## 2017-04-15 VITALS — BP 135/89 | HR 96 | Temp 98.6°F | Resp 16 | Ht 62.0 in | Wt 200.8 lb

## 2017-04-15 DIAGNOSIS — I10 Essential (primary) hypertension: Secondary | ICD-10-CM | POA: Diagnosis not present

## 2017-04-15 DIAGNOSIS — F411 Generalized anxiety disorder: Secondary | ICD-10-CM

## 2017-04-15 DIAGNOSIS — N946 Dysmenorrhea, unspecified: Secondary | ICD-10-CM | POA: Diagnosis not present

## 2017-04-15 DIAGNOSIS — J309 Allergic rhinitis, unspecified: Secondary | ICD-10-CM

## 2017-04-15 MED ORDER — AMLODIPINE BESYLATE 5 MG PO TABS: 5.0000 mg | ORAL_TABLET | Freq: Every day | ORAL | 1 refills | Status: DC

## 2017-04-15 MED ORDER — FLUTICASONE PROPIONATE 50 MCG/ACT NA SUSP: 2.0000 | Freq: Every day | NASAL | 3 refills | Status: DC

## 2017-04-15 MED ORDER — CETIRIZINE HCL 10 MG PO TABS: 10.0000 mg | ORAL_TABLET | Freq: Every day | ORAL | 1 refills | Status: DC

## 2017-04-15 MED ORDER — IBUPROFEN 800 MG PO TABS: 800.0000 mg | ORAL_TABLET | Freq: Four times a day (QID) | ORAL | 0 refills | Status: DC | PRN

## 2017-04-15 MED ORDER — HYDROXYZINE HCL 25 MG PO TABS: 25.0000 mg | ORAL_TABLET | Freq: Three times a day (TID) | ORAL | 1 refills | Status: DC | PRN

## 2017-04-15 NOTE — Progress Notes (Signed)
MRN: [id number] DOB: 1986-09-16  Subjective:   Alexis Donaldson is a 31 y.o. female presenting for follow up on Hypertension.   Currently managed with amlodipine 24m daily. Admits fair compliance. Will miss a few pills every few weeks. Patient is not checking blood pressure at home. Reports no side effects. Denies lightheadedness, dizziness, chronic headache, double vision, chest pain, shortness of breath, heart racing, palpitations, nausea, vomiting, abdominal pain, hematuria, lower leg swelling.   In terms of anxiety, notes it has been well controlled on hydroxyzine as needed. Notes she will take 1-2 a few times a week if she is under a lot of stress. However, some weeks, she does not need to take any at all.   In terms of allergies, she has ran out of zyrtec and flonase and therefore they have not been controlled. Endorses nasal congestion, sneezing, and itchy watery eyes.  She would also like a refill for ibuprofen, which she uses prn for menstrual cramps. Notes it works well for her.  Denies any other aggravating or relieving factors, no other questions or concerns.   Alexis Donaldson a current medication list which includes the following prescription(s): albuterol, amlodipine, beclomethasone, blood pressure kit, cetirizine, fluticasone, hydroxyzine, ibuprofen, and levonorgestrel. Also is allergic to effexor [venlafaxine] and zoloft [sertraline hcl].  Alexis Donaldson has a past medical history of Allergy; Anxiety; Asthma; Depression; and Hypertension. Also  has a past surgical history that includes Cervical biopsy and Wisdom tooth extraction.   Objective:   Vitals: BP 135/89   Pulse 96   Temp 98.6 F (37 C) (Oral)   Resp 16   Ht 5' 2" (1.575 m)   Wt 200 lb 12.8 oz (91.1 kg)   SpO2 99%   BMI 36.73 kg/m   Physical Exam  Constitutional: She is oriented to person, place, and time. She appears well-developed and well-nourished.  HENT:  Head: Normocephalic and atraumatic.  Right Ear: Tympanic  membrane, external ear and ear canal normal.  Left Ear: Tympanic membrane, external ear and ear canal normal.  Nose: Mucosal edema (severe bilaterally) present. No rhinorrhea.  Mouth/Throat: Uvula is midline, oropharynx is clear and moist and mucous membranes are normal.  Eyes: Conjunctivae are normal.  Neck: Normal range of motion.  Cardiovascular: Normal rate, regular rhythm, normal heart sounds and intact distal pulses.   Pulmonary/Chest: Effort normal and breath sounds normal.  Musculoskeletal:       Right lower leg: She exhibits no swelling.       Left lower leg: She exhibits no swelling.  Neurological: She is alert and oriented to person, place, and time.  Skin: Skin is warm and dry.  Psychiatric: She has a normal mood and affect.  Vitals reviewed.    BP Readings from Last 3 Encounters:  04/15/17 135/89  01/19/17 (!) 136/98  01/14/17 128/81   GAD 7 : Generalized Anxiety Score 04/15/2017  Nervous, Anxious, on Edge 0  Control/stop worrying 1  Worry too much - different things 0  Trouble relaxing 0  Restless 0  Easily annoyed or irritable 2  Afraid - awful might happen 0  Total GAD 7 Score 3  Anxiety Difficulty Not difficult at all   No results found for this or any previous visit (from the past 24 hour(s)).  Assessment and Plan :  1. Essential hypertension Controlled in office. Encouraged to take medication daily and check bp outside of office. Goal is <140/90. Return in 6 months for reevaluation.  - amLODipine (NORVASC) 5 MG tablet;  Take 1 tablet (5 mg total) by mouth daily.  Dispense: 90 tablet; Refill: 1  2. Anxiety state Well controlled. Return in 6 months for reevaluation. Will repeat GAD7 at this visit.  - hydrOXYzine (ATARAX/VISTARIL) 25 MG tablet; Take 1-2 tablets (25-50 mg total) by mouth 3 (three) times daily as needed.  Dispense: 90 tablet; Refill: 1  3. Allergic rhinitis, unspecified seasonality, unspecified trigger Uncontrolled. Encouraged to take  medication daily.  - cetirizine (ZYRTEC) 10 MG tablet; Take 1 tablet (10 mg total) by mouth daily.  Dispense: 90 tablet; Refill: 1 - fluticasone (FLONASE) 50 MCG/ACT nasal spray; Place 2 sprays into both nostrils daily.  Dispense: 16 g; Refill: 3  4. Menstrual pain - ibuprofen (ADVIL,MOTRIN) 800 MG tablet; Take 1 tablet (800 mg total) by mouth every 6 (six) hours as needed (as needed for pain).  Dispense: 90 tablet; Refill: 0  Alexis Delaine, PA-C  [facility] 04/15/2017 2:43 PM

## 2017-04-15 NOTE — Patient Instructions (Addendum)
Start taking amlodipine daily for high blood pressure. Try to monitor your blood pressure outside of the office at least a few times while on the medication. Your goal is <140/90. If it is consistently higher than this, please return to office for further evaluation. Otherwise, if it remains below this, you can return in 6 months.   In terms of allergies, start taking flonase and zyrtec daily as this will help tremendously with nasal congestion.  In terms of anxiety, I have refilled your hydroxyzine to use as needed. Please let me know if any of your symptoms worsen.   Thank you for letting me participate in your health and well being.     IF you received an x-ray today, you will receive an invoice from [facility]. Please contact [facility] at [phone] with questions or concerns regarding your invoice.   IF you received labwork today, you will receive an invoice from [facility]. Please contact [facility] at [phone] with questions or concerns regarding your invoice.   Our billing staff will not be able to assist you with questions regarding bills from these companies.  You will be contacted with the lab results as soon as they are available. The fastest way to get your results is to activate your My Chart account. Instructions are located on the last page of this paperwork. If you have not heard from [place] regarding the results in 2 weeks, please contact this office.

## 2017-10-20 ENCOUNTER — Encounter: Payer: Self-pay | Admitting: Family Medicine

## 2017-10-20 ENCOUNTER — Other Ambulatory Visit: Payer: Self-pay

## 2017-10-20 ENCOUNTER — Ambulatory Visit (INDEPENDENT_AMBULATORY_CARE_PROVIDER_SITE_OTHER): Payer: Managed Care, Other (non HMO) | Admitting: Family Medicine

## 2017-10-20 VITALS — BP 130/72 | HR 94 | Temp 99.1°F | Resp 18 | Ht 62.0 in | Wt 219.0 lb

## 2017-10-20 DIAGNOSIS — I10 Essential (primary) hypertension: Secondary | ICD-10-CM

## 2017-10-20 DIAGNOSIS — Z202 Contact with and (suspected) exposure to infections with a predominantly sexual mode of transmission: Secondary | ICD-10-CM

## 2017-10-20 MED ORDER — AMLODIPINE BESYLATE 5 MG PO TABS: 5.0000 mg | ORAL_TABLET | Freq: Every day | ORAL | 1 refills | Status: DC

## 2017-10-20 NOTE — Patient Instructions (Addendum)
Continue your amlodipine  Work on weight loss and regular exercise  Return to see your PCP regarding your ADHD questions  We will let you know your lab reports.  Call if no report 1 week.    IF you received an x-ray today, you will receive an invoice from [facility]. Please contact [facility] at [phone] with questions or concerns regarding your invoice.   IF you received labwork today, you will receive an invoice from [place]. Please contact [facility] at [phone] with questions or concerns regarding your invoice.   Our billing staff will not be able to assist you with questions regarding bills from these companies.  You will be contacted with the lab results as soon as they are available. The fastest way to get your results is to activate your My Chart account. Instructions are located on the last page of this paperwork. If you have not heard from [place] regarding the results in 2 weeks, please contact this office.

## 2017-10-20 NOTE — Addendum Note (Signed)
Addended by: Ceasia Elwell H on: 10/20/2017 10:22 AM   Modules accepted: Orders

## 2017-10-20 NOTE — Progress Notes (Signed)
Patient ID: Alexis Donaldson, female    DOB: [birth year]  Age: 32 y.o. MRN: [id number]  Chief Complaint  Patient presents with  . Hypertension    follow up on meds   . Exposure to STD    would like to be tested     Subjective:  Here for several things:  Needs refill on BP Med.  Amlodipine.  Knows she needs to lose weight and exercise  2 sexual partners past year, many in lifetime.  Worries that friends have assymptomatic HSV.  We discussed that.  Uses protection except with the regular boyfriend.  Has IUD  Asked about ADHD possibility.  Works long hours 2 jobs.  Advised she talks to her PCP.  Current allergies, medications, problem list, past/family and social histories reviewed.  Objective:  BP 130/72   Pulse 94   Temp 99.1 F (37.3 C) (Oral)   Resp 18   Ht 5\' 2"  (1.575 m)   Wt 219 lb (99.3 kg)   SpO2 99%   BMI 40.06 kg/m   HEENT normal Chest clear.  Heart RRR   Assessment & Plan:   Assessment: 1. Potential exposure to STD   2. Essential hypertension       Plan: See instructions.  Orders Placed This Encounter  Procedures  . C. trachomatis/N. gonorrhoeae RNA  . HSV(herpes simplex vrs) 1+2 ab-IgG  . RPR  . HIV antibody    Meds ordered this encounter  Medications  . amLODipine (NORVASC) 5 MG tablet    Sig: Take 1 tablet (5 mg total) by mouth daily.    Dispense:  90 tablet    Refill:  1         Patient Instructions   Continue your amlodipine  Work on weight loss and regular exercise  Return to see your PCP regarding your ADHD questions  We will let you know your lab reports.  Call if no report 1 week.    IF you received an x-ray today, you will receive an invoice from [facility]. Please contact [facility] at [phone] with questions or concerns regarding your invoice.   IF you received labwork today, you will receive an invoice from [facility]. Please contact [facility] at [phone] with questions or concerns regarding  your invoice.   Our billing staff will not be able to assist you with questions regarding bills from these companies.  You will be contacted with the lab results as soon as they are available. The fastest way to get your results is to activate your My Chart account. Instructions are located on the last page of this paperwork. If you have not heard from [place] regarding the results in 2 weeks, please contact this office.        Return in about 6 months (around 04/19/2018) for blood pressure .   HOPPER,DAVID, MD 10/20/2017

## 2017-10-21 LAB — HIV ANTIBODY (ROUTINE TESTING W REFLEX): HIV SCREEN 4TH GENERATION: NONREACTIVE

## 2017-10-21 LAB — GC/CHLAMYDIA PROBE AMP
Chlamydia trachomatis, NAA: NEGATIVE
Neisseria gonorrhoeae by PCR: NEGATIVE

## 2017-10-21 LAB — RPR: RPR: NONREACTIVE

## 2017-10-21 LAB — HSV(HERPES SIMPLEX VRS) I + II AB-IGG
HSV 1 GLYCOPROTEIN G AB, IGG: 42 {index} — AB (ref 0.00–0.90)
HSV 2 IGG, TYPE SPEC: 8.33 {index} — AB (ref 0.00–0.90)

## 2017-11-02 ENCOUNTER — Encounter: Payer: Self-pay | Admitting: Physician Assistant

## 2017-11-02 ENCOUNTER — Ambulatory Visit: Payer: Managed Care, Other (non HMO) | Admitting: Physician Assistant

## 2017-11-02 ENCOUNTER — Other Ambulatory Visit: Payer: Self-pay

## 2017-11-02 VITALS — BP 121/81 | HR 83 | Temp 98.7°F | Resp 18 | Ht 62.8 in | Wt 215.4 lb

## 2017-11-02 DIAGNOSIS — R41840 Attention and concentration deficit: Secondary | ICD-10-CM | POA: Diagnosis not present

## 2017-11-02 DIAGNOSIS — G8929 Other chronic pain: Secondary | ICD-10-CM

## 2017-11-02 DIAGNOSIS — N946 Dysmenorrhea, unspecified: Secondary | ICD-10-CM | POA: Diagnosis not present

## 2017-11-02 DIAGNOSIS — R894 Abnormal immunological findings in specimens from other organs, systems and tissues: Secondary | ICD-10-CM

## 2017-11-02 DIAGNOSIS — M545 Low back pain, unspecified: Secondary | ICD-10-CM

## 2017-11-02 LAB — CMP14+EGFR
A/G RATIO: 1.1 — AB (ref 1.2–2.2)
ALK PHOS: 96 IU/L (ref 39–117)
ALT: 22 IU/L (ref 0–32)
AST: 21 IU/L (ref 0–40)
Albumin: 4.5 g/dL (ref 3.5–5.5)
BILIRUBIN TOTAL: 0.3 mg/dL (ref 0.0–1.2)
BUN/Creatinine Ratio: 6 — ABNORMAL LOW (ref 9–23)
BUN: 5 mg/dL — ABNORMAL LOW (ref 6–20)
CO2: 20 mmol/L (ref 20–29)
Calcium: 9.4 mg/dL (ref 8.7–10.2)
Chloride: 101 mmol/L (ref 96–106)
Creatinine, Ser: 0.78 mg/dL (ref 0.57–1.00)
GFR calc non Af Amer: 102 mL/min/{1.73_m2} (ref >59)
GFR, EST AFRICAN AMERICAN: 117 mL/min/{1.73_m2} (ref >59)
Globulin, Total: 4.1 g/dL (ref 1.5–4.5)
Glucose: 119 mg/dL — ABNORMAL HIGH (ref 65–99)
Potassium: 4.5 mmol/L (ref 3.5–5.2)
Sodium: 138 mmol/L (ref 134–144)
TOTAL PROTEIN: 8.6 g/dL — AB (ref 6.0–8.5)

## 2017-11-02 MED ORDER — CYCLOBENZAPRINE HCL 5 MG PO TABS: 5.0000 mg | ORAL_TABLET | Freq: Three times a day (TID) | ORAL | 0 refills | Status: DC | PRN

## 2017-11-02 MED ORDER — IBUPROFEN 800 MG PO TABS: 800.0000 mg | ORAL_TABLET | Freq: Four times a day (QID) | ORAL | 0 refills | Status: DC | PRN

## 2017-11-02 NOTE — Progress Notes (Signed)
Alexis Donaldson  MRN: [id number] DOB: 1986-06-20  Subjective:  Alexis Donaldson is a 32 y.o. female seen in office today for a chief complaint of attention issues.  Patient notes she has struggled with attention issues since she was younger.  She has been diagnosed with ADHD in the past but never tried medication.  She believes that lack of concentration is increasing her anxiety.  She is using hydroxyzine 25 mg occasionally for moments of increased anxiety.  Has only had to do this a few times over the past few months.  In terms of lack of concentration, she notes that at work she will go from task to task without completion of them.  She often procrastinates doing work until the very last minute.  She has not been evaluated for ADHD since she was a child.  Patient would also like to discuss refill for ibuprofen.  Notes that she takes ibuprofen occasionally for bilateral low back pain and muscle spasm and also for menstrual cramping.  Notes she has struggled with low back pain for 15 years.  The pain is not daily.  She stretches occasionally.  She does not use heating pad.  Denies midline tenderness, saddle anesthesia, bladder/bowel incontinence, numbness, tingling, weakness, hematuria, urinary frequency, and dysuria.  She has never seen a chiropractor before but thinks that she would benefit from this.  Last CMP in 12/2016 showed normal creatinine and GFR.  Would also like to discuss HSV labs.  Notes she had a blood test in 10/2017 and the HSV was positive for HSV-1 and HSV-2.  She denies any previous outbreaks on her mouth or genitals.  Review of Systems  Constitutional: Negative for chills, diaphoresis and fever.  Musculoskeletal: Negative for gait problem.  Neurological: Negative for dizziness and light-headedness.      Patient Active Problem List   Diagnosis Date Noted  . Driving safety issue 01/19/2017  . Mild persistent asthma 01/19/2017    Current Outpatient Medications on File Prior to  Visit  Medication Sig Dispense Refill  . albuterol (PROVENTIL HFA;VENTOLIN HFA) 108 (90 Base) MCG/ACT inhaler Inhale 2 puffs into the lungs every 4 (four) hours as needed for wheezing or shortness of breath (cough, shortness of breath or wheezing.). 1 Inhaler 1  . amLODipine (NORVASC) 5 MG tablet Take 1 tablet (5 mg total) by mouth daily. 90 tablet 1  . beclomethasone (QVAR) 40 MCG/ACT inhaler Inhale into the lungs 2 (two) times daily.    . Blood Pressure Monitoring (BLOOD PRESSURE KIT) DEVI 1 Units by Does not apply route daily. 1 Device 0  . cetirizine (ZYRTEC) 10 MG tablet Take 1 tablet (10 mg total) by mouth daily. 90 tablet 1  . fluticasone (FLONASE) 50 MCG/ACT nasal spray Place 2 sprays into both nostrils daily. 16 g 3  . hydrOXYzine (ATARAX/VISTARIL) 25 MG tablet Take 1-2 tablets (25-50 mg total) by mouth 3 (three) times daily as needed. 90 tablet 1  . ibuprofen (ADVIL,MOTRIN) 800 MG tablet Take 1 tablet (800 mg total) by mouth every 6 (six) hours as needed (as needed for pain). 90 tablet 0  . levonorgestrel (MIRENA) 20 MCG/24HR IUD by Intrauterine route.     No current facility-administered medications on file prior to visit.     Allergies  Allergen Reactions  . Effexor [Venlafaxine] Anxiety  . Zoloft [Sertraline Hcl] Other (See Comments)     Objective:  BP 121/81 (BP Location: Right Arm, Patient Position: Sitting, Cuff Size: Normal)   Pulse 83   Temp  98.7 F (37.1 C) (Oral)   Resp 18   Ht 5' 2.8" (1.595 m)   Wt 215 lb 6.4 oz (97.7 kg)   SpO2 99%   BMI 38.41 kg/m   Physical Exam  Constitutional: She is oriented to person, place, and time and well-developed, well-nourished, and in no distress.  HENT:  Head: Normocephalic and atraumatic.  Eyes: Conjunctivae are normal.  Neck: Normal range of motion.  Pulmonary/Chest: Effort normal.  Musculoskeletal:       Cervical back: Normal.       Thoracic back: Normal.       Lumbar back: Normal. She exhibits normal range of  motion, no tenderness and no bony tenderness.  Neurological: She is alert and oriented to person, place, and time. She has a normal Straight Leg Raise Test. Gait normal.  Reflex Scores:      Patellar reflexes are 2+ on the right side and 2+ on the left side.      Achilles reflexes are 2+ on the right side and 2+ on the left side. Skin: Skin is warm and dry.  Psychiatric: Affect normal.  Vitals reviewed.    BP Readings from Last 3 Encounters:  11/02/17 121/81  10/20/17 130/72  04/15/17 135/89     Assessment and Plan :  1. Lack of concentration Due to age and no prior history of medication use for ADHD, will refer to [place] attention specialist for further evaluation.  Patient educated that if she is diagnosed with ADHD and started on medication,  she can bring her documentation here and we will be happy to continue care for ADHD at that time. - Ambulatory referral to Psychiatry  2. Menstrual pain - CMP14+EGFR - ibuprofen (ADVIL,MOTRIN) 800 MG tablet; Take 1 tablet (800 mg total) by mouth every 6 (six) hours as needed (as needed for pain).  Dispense: 90 tablet; Refill: 0 3. Chronic bilateral low back pain without sciatica Due to extensive use of ibuprofen, will recheck CMP at this time.  Patient would also benefit from intermittent Flexeril to use for muscle spasms.  She is asymptomatic today.  Advised to apply heat to affected area when she is having muscle spasms and also to stretch and drink water daily.  Return here as needed. - cyclobenzaprine (FLEXERIL) 5 MG tablet; Take 1 tablet (5 mg total) by mouth 3 (three) times daily as needed for muscle spasms.  Dispense: 60 tablet; Refill: 0  4. Positive test for herpes simplex virus (HSV) antibody Educated on HSV testing and HSV infection.  Patient has never had a outbreak.  Advised that if she does have an outbreak, to seek care immediately.  Given educational material on cold sores and genital herpes.  Tenna Delaine PA-C  [facility] 11/02/2017 8:35 AM

## 2017-11-02 NOTE — Patient Instructions (Addendum)
For attention issues, I have given you a referral to [place] [facility]. You should hear from them in 1-2 weeks for an appointment. Once you have the dx, I am happy to care for you here.   For muscle spasms, I recommend daily stretching, heating pad 4-5 x a day for 20 minutes at time, and muscle relaxants as needed. Use ibuprofen as needed. We are checking your kidneys today.    Just to know, flexeril can cause side effects that may impair your thinking or reactions. Be careful if you drive or do anything that requires you to be awake and alert. void drinking alcohol, which can increase some of the side effects of Flexeril.  NSAIDs like ibuprofen have common side effects of heartburn, stomach pain, indigestion, and headache. Could lead to renal insufficiency, stroke, or GI bleed if taken excess amounts outside of what is recommended on label long term.    You should avoid heavy lifting or strenuous repetitive activity when you have the pain.  Below is info about HSV. If you ever have an outbreak, please seek care immediately. Thank you for letting me participate in your health and well being.   FLEXION RANGE OF MOTION AND STRETCHING EXERCISES: STRETCH - Flexion, Single Knee to Chest   Lie on a firm bed or floor with both legs extended in front of you.  Keeping one leg in contact with the floor, bring your opposite knee to your chest. Hold your leg in place by either grabbing behind your thigh or at your knee.  Pull until you feel a gentle stretch in your lower back.   Slowly release your grasp and repeat the exercise with the opposite side.  STRETCH - Flexion, Double Knee to Chest   Lie on a firm bed or floor with both legs extended in front of you.  Keeping one leg in contact with the floor, bring your opposite knee to your chest.  Tense your stomach muscles to support your back and then lift your other knee to your chest. Hold your legs in place by either grabbing behind  your thighs or at your knees.  Pull both knees toward your chest until you feel a gentle stretch in your lower back.   Tense your stomach muscles and slowly return one leg at a time to the floor.  STRETCH - Low Trunk Rotation  Lie on a firm bed or floor. Keeping your legs in front of you, bend your knees so they are both pointed toward the ceiling and your feet are flat on the floor.  Extend your arms out to the side. This will stabilize your upper body by keeping your shoulders in contact with the floor.  Gently and slowly drop both knees together to one side until you feel a gentle stretch in your lower back.   Tense your stomach muscles to support your lower back as you bring your knees back to the starting position. Repeat the exercise to the other side.   EXTENSION RANGE OF MOTION AND FLEXIBILITY EXERCISES: STRETCH - Extension, Prone on Elbows   Lie on your stomach on the floor, a bed will be too soft. Place your palms about shoulder width apart and at the height of your head.  Place your elbows under your shoulders. If this is too painful, stack pillows under your chest.  Allow your body to relax so that your hips drop lower and make contact more completely with the floor.  Slowly return to lying flat on  the floor.  RANGE OF MOTION - Extension, Prone Press Ups  Lie on your stomach on the floor, a bed will be too soft. Place your palms about shoulder width apart and at the height of your head.  Keeping your back as relaxed as possible, slowly straighten your elbows while keeping your hips on the floor. You may adjust the placement of your hands to maximize your comfort. As you gain motion, your hands will come more underneath your shoulders.  Slowly return to lying flat on the floor.  RANGE OF MOTION- Quadruped, Neutral Spine   Assume a hands and knees position on a firm surface. Keep your hands under your shoulders and your knees under your hips. You may place padding under  your knees for comfort.  Drop your head and point your tail bone toward the ground below you. This will round out your lower back like an angry cat.    Slowly lift your head and release your tail bone so that your back sags into a large arch, like an old horse.  Repeat this until you feel limber in your lower back.  Now, find your "sweet spot." This will be the most comfortable position somewhere between the two previous positions. This is your neutral spine. Once you have found this position, tense your stomach muscles to support your lower back.  STRENGTHENING EXERCISES - Low Back Strain These exercises may help you when beginning to rehabilitate your injury. These exercises should be done near your "sweet spot." This is the neutral, low-back arch, somewhere between fully rounded and fully arched, that is your least painful position. When performed in this safe range of motion, these exercises can be used for people who have either a flexion or extension based injury. These exercises may resolve your symptoms with or without further involvement from your physician, physical therapist or athletic trainer. While completing these exercises, remember:   Muscles can gain both the endurance and the strength needed for everyday activities through controlled exercises.  Complete these exercises as instructed by your physician, physical therapist or athletic trainer. Increase the resistance and repetitions only as guided.  You may experience muscle soreness or fatigue, but the pain or discomfort you are trying to eliminate should never worsen during these exercises. If this pain does worsen, stop and make certain you are following the directions exactly. If the pain is still present after adjustments, discontinue the exercise until you can discuss the trouble with your caregiver.  STRENGTHENING - Deep Abdominals, Pelvic Tilt  Lie on a firm bed or floor. Keeping your legs in front of you, bend your  knees so they are both pointed toward the ceiling and your feet are flat on the floor.  Tense your lower abdominal muscles to press your lower back into the floor. This motion will rotate your pelvis so that your tail bone is scooping upwards rather than pointing at your feet or into the floor.  STRENGTHENING - Abdominals, Crunches   Lie on a firm bed or floor. Keeping your legs in front of you, bend your knees so they are both pointed toward the ceiling and your feet are flat on the floor. Cross your arms over your chest.  Slightly tip your chin down without bending your neck.  Tense your abdominals and slowly lift your trunk high enough to just clear your shoulder blades. Lifting higher can put excessive stress on the lower back and does not further strengthen your abdominal muscles.  Control your return  to the starting position.  STRENGTHENING - Quadruped, Opposite UE/LE Lift   Assume a hands and knees position on a firm surface. Keep your hands under your shoulders and your knees under your hips. You may place padding under your knees for comfort.  Find your neutral spine and gently tense your abdominal muscles so that you can maintain this position. Your shoulders and hips should form a rectangle that is parallel with the floor and is not twisted.  Keeping your trunk steady, lift your right hand no higher than your shoulder and then your left leg no higher than your hip. Make sure you are not holding your breath.   Continuing to keep your abdominal muscles tense and your back steady, slowly return to your starting position. Repeat with the opposite arm and leg.  STRENGTHENING - Lower Abdominals, Double Knee Lift  Lie on a firm bed or floor. Keeping your legs in front of you, bend your knees so they are both pointed toward the ceiling and your feet are flat on the floor.  Tense your abdominal muscles to brace your lower back and slowly lift both of your knees until they come over your  hips. Be certain not to hold your breath.  POSTURE AND BODY MECHANICS CONSIDERATIONS - Low Back Strain Keeping correct posture when sitting, standing or completing your activities will reduce the stress put on different body tissues, allowing injured tissues a chance to heal and limiting painful experiences. The following are general guidelines for improved posture. Your physician or physical therapist will provide you with any instructions specific to your needs. While reading these guidelines, remember:  The exercises prescribed by your provider will help you have the flexibility and strength to maintain correct postures.  The correct posture provides the best environment for your joints to work. All of your joints have less wear and tear when properly supported by a spine with good posture. This means you will experience a healthier, less painful body.  Correct posture must be practiced with all of your activities, especially prolonged sitting and standing. Correct posture is as important when doing repetitive low-stress activities (typing) as it is when doing a single heavy-load activity (lifting). RESTING POSITIONS Consider which positions are most painful for you when choosing a resting position. If you have pain with flexion-based activities (sitting, bending, stooping, squatting), choose a position that allows you to rest in a less flexed posture. You would want to avoid curling into a fetal position on your side. If your pain worsens with extension-based activities (prolonged standing, working overhead), avoid resting in an extended position such as sleeping on your stomach. Most people will find more comfort when they rest with their spine in a more neutral position, neither too rounded nor too arched. Lying on a non-sagging bed on your side with a pillow between your knees, or on your back with a pillow under your knees will often provide some relief. Keep in mind, being in any one position for  a prolonged period of time, no matter how correct your posture, can still lead to stiffness. PROPER SITTING POSTURE In order to minimize stress and discomfort on your spine, you must sit with correct posture. Sitting with good posture should be effortless for a healthy body. Returning to good posture is a gradual process. Many people can work toward this most comfortably by using various supports until they have the flexibility and strength to maintain this posture on their own. When sitting with proper posture, your ears  will fall over your shoulders and your shoulders will fall over your hips. You should use the back of the chair to support your upper back. Your lower back will be in a neutral position, just slightly arched. You may place a small pillow or folded towel at the base of your lower back for support.  When working at a desk, create an environment that supports good, upright posture. Without extra support, muscles tire, which leads to excessive strain on joints and other tissues. Keep these recommendations in mind: CHAIR:  A chair should be able to slide under your desk when your back makes contact with the back of the chair. This allows you to work closely.  The chair's height should allow your eyes to be level with the upper part of your monitor and your hands to be slightly lower than your elbows. BODY POSITION  Your feet should make contact with the floor. If this is not possible, use a foot rest.  Keep your ears over your shoulders. This will reduce stress on your neck and lower back. INCORRECT SITTING POSTURES  If you are feeling tired and unable to assume a healthy sitting posture, do not slouch or slump. This puts excessive strain on your back tissues, causing more damage and pain. Healthier options include:  Using more support, like a lumbar pillow.  Switching tasks to something that requires you to be upright or walking.  Talking a brief walk.  Lying down to rest in a  neutral-spine position. PROLONGED STANDING WHILE SLIGHTLY LEANING FORWARD  When completing a task that requires you to lean forward while standing in one place for a long time, place either foot up on a stationary 2-4 inch high object to help maintain the best posture. When both feet are on the ground, the lower back tends to lose its slight inward curve. If this curve flattens (or becomes too large), then the back and your other joints will experience too much stress, tire more quickly, and can cause pain. CORRECT STANDING POSTURES Proper standing posture should be assumed with all daily activities, even if they only take a few moments, like when brushing your teeth. As in sitting, your ears should fall over your shoulders and your shoulders should fall over your hips. You should keep a slight tension in your abdominal muscles to brace your spine. Your tailbone should point down to the ground, not behind your body, resulting in an over-extended swayback posture.  INCORRECT STANDING POSTURES  Common incorrect standing postures include a forward head, locked knees and/or an excessive swayback. WALKING Walk with an upright posture. Your ears, shoulders and hips should all line-up. PROLONGED ACTIVITY IN A FLEXED POSITION When completing a task that requires you to bend forward at your waist or lean over a low surface, try to find a way to stabilize 3 out of 4 of your limbs. You can place a hand or elbow on your thigh or rest a knee on the surface you are reaching across. This will provide you more stability so that your muscles do not fatigue as quickly. By keeping your knees relaxed, or slightly bent, you will also reduce stress across your lower back. CORRECT LIFTING TECHNIQUES DO :   Assume a wide stance. This will provide you more stability and the opportunity to get as close as possible to the object which you are lifting.  Tense your abdominals to brace your spine. Bend at the knees and hips.  Keeping your back locked in a  neutral-spine position, lift using your leg muscles. Lift with your legs, keeping your back straight.  Test the weight of unknown objects before attempting to lift them.  Try to keep your elbows locked down at your sides in order get the best strength from your shoulders when carrying an object.  Always ask for help when lifting heavy or awkward objects. INCORRECT LIFTING TECHNIQUES DO NOT:   Lock your knees when lifting, even if it is a small object.  Bend and twist. Pivot at your feet or move your feet when needing to change directions.  Assume that you can safely pick up even a paper clip without proper posture.      Cold Sore A cold sore, also called a fever blister, is a skin infection that is caused by a virus. This infection causes small, fluid-filled sores to form inside of the mouth or on the lips, gums, nose, chin, or cheeks. Cold sores can spread to other parts of the body, such as the eyes or fingers. Cold sores can be spread or passed from person to person (contagious) until the sores crust over completely. Cold sores can be spread through close contact, such as kissing or sharing a drinking glass. Follow these instructions at home: Medicines  Take or apply over-the-counter and prescription medicines only as told by your doctor.  Use a cotton-tip swab to apply creams or gels to your sores. Sore Care  Do not touch the sores or pick the scabs.  Wash your hands often. Do not touch your eyes without washing your hands first.  Keep the sores clean and dry.  If directed, apply ice to the sores:  Put ice in a plastic bag.  Place a towel between your skin and the bag.  Leave the ice on for 20 minutes, 2-3 times per day. Lifestyle  Do not kiss, have oral sex, or share personal items until your sores heal.  Eat a soft, bland diet. Avoid eating hot, cold, or salty foods. These can hurt your mouth.  Use a straw if it hurts to drink out  of a glass.  Avoid the sun and limit your stress if these things trigger outbreaks. If sun causes cold sores, apply sunscreen on your lips before being out in the sun. Contact a doctor if:  You have symptoms for more than two weeks.  You have pus coming from the sores.  You have redness that is spreading.  You have pain or irritation in your eye.  You get sores on your genitals.  Your sores do not heal within two weeks.  You get cold sores often. Get help right away if:  You have a fever and your symptoms suddenly get worse.  You have a headache and confusion. This information is not intended to replace advice given to you by your health care provider. Make sure you discuss any questions you have with your health care provider. Document Released: 03/23/2012 Document Revised: 02/28/2016 Document Reviewed: 07/13/2015 Elsevier Interactive Patient Education  2018 [employer].  Genital Herpes Genital herpes is a common sexually transmitted infection (STI) that is caused by a virus. The virus spreads from person to person through sexual contact. Infection can cause itching, blisters, and sores around the genitals or rectum. Symptoms may last several days and then go away This is called an outbreak. However, the virus remains in your body, so you may have more outbreaks in the future. The time between outbreaks varies and can be months or years. Genital  herpes affects men and women. It is particularly concerning for pregnant women because the virus can be passed to the baby during delivery and can cause serious problems. Genital herpes is also a concern for people who have a weak disease-fighting (immune) system. What are the causes? This condition is caused by the herpes simplex virus (HSV) type 1 or type 2. The virus may spread through:  Sexual contact with an infected person, including vaginal, anal, and oral sex.  Contact with fluid from a herpes sore.  The skin. This means that  you can get herpes from an infected partner even if he or she does not have a visible sore or does not know that he or she is infected.  What increases the risk? You are more likely to develop this condition if:  You have sex with many partners.  You do not use latex condoms during sex.  What are the signs or symptoms? Most people do not have symptoms (asymptomatic) or have mild symptoms that may be mistaken for other skin problems. Symptoms may include:  Small red bumps near the genitals, rectum, or mouth. These bumps turn into blisters and then turn into sores.  Flu-like symptoms, including: ? Fever. ? Body aches. ? Swollen lymph nodes. ? Headache.  Painful urination.  Pain and itching in the genital area or rectal area.  Vaginal discharge.  Tingling or shooting pain in the legs and buttocks.  Generally, symptoms are more severe and last longer during the first (primary) outbreak. Flu-like symptoms are also more common during the primary outbreak. How is this diagnosed? Genital herpes may be diagnosed based on:  A physical exam.  Your medical history.  Blood tests.  A test of a fluid sample (culture) from an open sore.  How is this treated? There is no cure for this condition, but treatment with antiviral medicines that are taken by mouth (orally) can do the following:  Speed up healing and relieve symptoms.  Help to reduce the spread of the virus to sexual partners.  Limit the chance of future outbreaks, or make future outbreaks shorter.  Lessen symptoms of future outbreaks.  Your health care provider may also recommend pain relief medicines, such as aspirin or ibuprofen. Follow these instructions at home: Sexual activity  Do not have sexual contact during active outbreaks.  Practice safe sex. Latex condoms and female condoms may help prevent the spread of the herpes virus. General instructions  Keep the affected areas dry and clean.  Take  over-the-counter and prescription medicines only as told by your health care provider.  Avoid rubbing or touching blisters and sores. If you do touch blisters or sores: ? Wash your hands thoroughly with soap and water. ? Do not touch your eyes afterward.  To help relieve pain or itching, you may take the following actions as directed by your health care provider: ? Apply a cold, wet cloth (cold compress) to affected areas 4-6 times a day. ? Apply a substance that protects your skin and reduces bleeding (astringent). ? Apply a gel that helps relieve pain around sores (lidocaine gel). ? Take a warm, shallow bath that cleans the genital area (sitz bath).  Keep all follow-up visits as told by your health care provider. This is important. How is this prevented?  Use condoms. Although anyone can get genital herpes during sexual contact, even with the use of a condom, a condom can provide some protection.  Avoid having multiple sexual partners.  Talk with your sexual  partner about any symptoms either of you may have. Also, talk with your partner about any history of STIs.  Get tested for STIs before you have sex. Ask your partner to do the same.  Do not have sexual contact if you have symptoms of genital herpes. Contact a health care provider if:  Your symptoms are not improving with medicine.  Your symptoms return.  You have new symptoms.  You have a fever.  You have abdominal pain.  You have redness, swelling, or pain in your eye.  You notice new sores on other parts of your body.  You are a woman and experience bleeding between menstrual periods.  You have had herpes and you become pregnant or plan to become pregnant. Summary  Genital herpes is a common sexually transmitted infection (STI) that is caused by the herpes simplex virus (HSV) type 1 or type 2.  These viruses are most often spread through sexual contact with an infected person.  You are more likely to develop  this condition if you have sex with many partners or you have unprotected sex.  Most people do not have symptoms (asymptomatic) or have mild symptoms that may be mistaken for other skin problems. Symptoms occur as outbreaks that may happen months or years apart.  There is no cure for this condition, but treatment with oral antiviral medicines can reduce symptoms, reduce the chance of spreading the virus to a partner, prevent future outbreaks, or shorten future outbreaks. This information is not intended to replace advice given to you by your health care provider. Make sure you discuss any questions you have with your health care provider. Document Released: 09/19/2000 Document Revised: 08/22/2016 Document Reviewed: 08/22/2016 Elsevier Interactive Patient Education  2018 [employer].   IF you received an x-ray today, you will receive an invoice from [facility]. Please contact [facility] at [phone] with questions or concerns regarding your invoice.   IF you received labwork today, you will receive an invoice from [facility]. Please contact [facility] at [phone] with questions or concerns regarding your invoice.   Our billing staff will not be able to assist you with questions regarding bills from these companies.  You will be contacted with the lab results as soon as they are available. The fastest way to get your results is to activate your My Chart account. Instructions are located on the last page of this paperwork. If you have not heard from [place] regarding the results in 2 weeks, please contact this office.

## 2017-11-10 ENCOUNTER — Encounter: Payer: Self-pay | Admitting: Physician Assistant

## 2018-01-20 ENCOUNTER — Encounter: Payer: Self-pay | Admitting: Physician Assistant

## 2018-03-02 ENCOUNTER — Ambulatory Visit: Payer: Managed Care, Other (non HMO) | Admitting: Physician Assistant

## 2018-03-02 ENCOUNTER — Encounter: Payer: Self-pay | Admitting: Physician Assistant

## 2018-03-05 ENCOUNTER — Ambulatory Visit: Payer: Managed Care, Other (non HMO) | Admitting: Gynecology

## 2018-03-05 ENCOUNTER — Encounter: Payer: Self-pay | Admitting: Gynecology

## 2018-03-05 VITALS — BP 118/74 | Ht 62.0 in | Wt 222.0 lb

## 2018-03-05 DIAGNOSIS — Z309 Encounter for contraceptive management, unspecified: Secondary | ICD-10-CM | POA: Diagnosis not present

## 2018-03-05 DIAGNOSIS — Z113 Encounter for screening for infections with a predominantly sexual mode of transmission: Secondary | ICD-10-CM

## 2018-03-05 DIAGNOSIS — Z30431 Encounter for routine checking of intrauterine contraceptive device: Secondary | ICD-10-CM

## 2018-03-05 DIAGNOSIS — Z1151 Encounter for screening for human papillomavirus (HPV): Secondary | ICD-10-CM

## 2018-03-05 DIAGNOSIS — Z01419 Encounter for gynecological examination (general) (routine) without abnormal findings: Secondary | ICD-10-CM

## 2018-03-05 NOTE — Patient Instructions (Signed)
Schedule and follow-up for the IUD replacement exam

## 2018-03-05 NOTE — Addendum Note (Signed)
Addended by: Nelva Nay on: 03/05/2018 12:48 PM   Modules accepted: Orders

## 2018-03-05 NOTE — Progress Notes (Signed)
    Jearlene Bridwell 1985/10/09 [id number]        32 y.o.  G0P0000 new patient for annual gynecologic exam.  Without gynecologic complaints.  She does want to discuss her contraceptive options.  Currently using Mirena IUD due to be replaced next month.  She is doing well with this with occasional spotting.  Past medical history,surgical history, problem list, medications, allergies, family history and social history were all reviewed and documented as reviewed in the EPIC chart.  ROS:  Performed with pertinent positives and negatives included in the history, assessment and plan.   Additional significant findings : None   Exam: [name] assistant Vitals:   03/05/18 1206  BP: 118/74  Weight: 222 lb (100.7 kg)  Height: 5\' 2"  (1.575 m)   Body mass index is 40.6 kg/m.  General appearance:  Normal affect, orientation and appearance. Skin: Grossly normal HEENT: Without gross lesions.  No cervical or supraclavicular adenopathy. Thyroid normal.  Lungs:  Clear without wheezing, rales or rhonchi Cardiac: RR, without RMG Abdominal:  Soft, nontender, without masses, guarding, rebound, organomegaly or hernia Breasts:  Examined lying and sitting without masses, retractions, discharge or axillary adenopathy. Pelvic:  Ext, BUS, Vagina: Normal  Cervix: Normal with IUD string visualized.  Pap smear/HPV, GC/chlamydia  Uterus: Anteverted, normal size, shape and contour, midline and mobile nontender   Adnexa: Without masses or tenderness    Anus and perineum: Normal   Rectovaginal: Normal sphincter tone without palpated masses or tenderness.    Assessment/Plan:  32 y.o. G0P0000 female for annual gynecologic exam without menses, Mirena IUD.   1. Contraceptive management.  Doing well with Mirena IUD.  Wanted to discuss alternatives.  Reviewed all alternatives to include pill, patch, ring, Nexplanon, Depo-Provera, replacing IUD and sterilization.  The pros and cons of each choice discussed.  She does  smoke and the increased risk of thrombosis with combination contraception discussed.  After a discussion the patient is going to have her Mirena IUD replaced and she will make an appointment for this. 2. STD screening.  Patient requests STD screening.  No known exposure.  She does have a history of chlamydia x2 and positive HSV antibodies although no clinical outbreak history.  GC/chlamydia, HIV, RPR, hepatitis B, hepatitis C done. 3. Breast health.  Breast exam is normal.  SBE monthly reviewed. 4. Pap smear/HPV done today.  History of cryosurgery x2 in a number of years ago.  No recent abnormal Pap smears. 5. Health maintenance.  No routine lab work done as patient reports this done at her primary physician's office.  Follow-up for IUD replacement appointment.  Follow-up in 1 year for annual exam.   Anastasio Auerbach MD, 12:38 PM 03/05/2018

## 2018-03-08 LAB — HEPATITIS C ANTIBODY
HEP C AB: NONREACTIVE
SIGNAL TO CUT-OFF: 0.1 (ref <1.00)

## 2018-03-08 LAB — PAP IG AND HPV HIGH-RISK: HPV DNA High Risk: NOT DETECTED

## 2018-03-08 LAB — C. TRACHOMATIS/N. GONORRHOEAE RNA
C. TRACHOMATIS RNA, TMA: NOT DETECTED
N. GONORRHOEAE RNA, TMA: NOT DETECTED

## 2018-03-08 LAB — RPR: RPR Ser Ql: NONREACTIVE

## 2018-03-08 LAB — HIV ANTIBODY (ROUTINE TESTING W REFLEX): HIV: NONREACTIVE

## 2018-03-08 LAB — HEPATITIS B SURFACE ANTIGEN: HEP B S AG: NONREACTIVE

## 2018-03-12 ENCOUNTER — Encounter: Payer: Self-pay | Admitting: Gynecology

## 2018-04-07 ENCOUNTER — Encounter: Payer: Self-pay | Admitting: Gynecology

## 2018-04-07 ENCOUNTER — Ambulatory Visit (INDEPENDENT_AMBULATORY_CARE_PROVIDER_SITE_OTHER): Payer: BLUE CROSS/BLUE SHIELD | Admitting: Gynecology

## 2018-04-07 VITALS — BP 128/76

## 2018-04-07 DIAGNOSIS — Z30433 Encounter for removal and reinsertion of intrauterine contraceptive device: Secondary | ICD-10-CM

## 2018-04-07 NOTE — Progress Notes (Signed)
    Alexis Donaldson Nov 25, 1985 [id number]        31 y.o.  G0P0000  presents for Mirena IUD removal and replacement. She has read through the booklet, has no contraindications and signed the consent form.  She is at the 5-year mark with her Mirena IUD.  I reviewed the removal and insertional process with her as well as the risks to include infection, either immediate or long-term, uterine perforation or migration requiring surgery to remove, other complications such as pain, hormonal side effects, infertility and possibility of failure with subsequent pregnancy.   Exam with [name] assistant Vitals:   04/07/18 1503  BP: 128/76    Pelvic: External BUS vagina normal. Cervix normal with IUD string visible. Uterus anteverted normal size shape contour midline mobile nontender. Adnexa without masses or tenderness.  Procedure: The cervix was visualized with a speculum and the old Mirena IUD string was grasped with a [place] forcep, the IUD removed, shown to the patient and discarded  The cervix was then cleansed with Betadine, anterior lip grasped with a single-tooth tenaculum, the uterus was sounded and a new Mirena IUD was placed according to manufacturer's recommendations without difficulty. The strings were trimmed. The patient tolerated well and will follow up in one month for a postinsertional check.  Lot number:  [id number]    Anastasio Auerbach MD, 3:17 PM 04/07/2018

## 2018-04-07 NOTE — Patient Instructions (Signed)

## 2018-04-16 ENCOUNTER — Ambulatory Visit: Payer: Managed Care, Other (non HMO) | Admitting: Physician Assistant

## 2018-04-22 ENCOUNTER — Encounter: Payer: Self-pay | Admitting: Physician Assistant

## 2018-04-22 ENCOUNTER — Other Ambulatory Visit: Payer: Self-pay

## 2018-04-22 ENCOUNTER — Ambulatory Visit: Payer: BLUE CROSS/BLUE SHIELD | Admitting: Physician Assistant

## 2018-04-22 VITALS — BP 120/80 | HR 93 | Temp 98.7°F | Ht 62.99 in | Wt 219.0 lb

## 2018-04-22 DIAGNOSIS — J309 Allergic rhinitis, unspecified: Secondary | ICD-10-CM | POA: Diagnosis not present

## 2018-04-22 DIAGNOSIS — F411 Generalized anxiety disorder: Secondary | ICD-10-CM

## 2018-04-22 DIAGNOSIS — M545 Low back pain: Secondary | ICD-10-CM | POA: Diagnosis not present

## 2018-04-22 DIAGNOSIS — I10 Essential (primary) hypertension: Secondary | ICD-10-CM | POA: Diagnosis not present

## 2018-04-22 DIAGNOSIS — Z6838 Body mass index (BMI) 38.0-38.9, adult: Secondary | ICD-10-CM

## 2018-04-22 DIAGNOSIS — G8929 Other chronic pain: Secondary | ICD-10-CM

## 2018-04-22 MED ORDER — CETIRIZINE HCL 10 MG PO TABS: 10.0000 mg | ORAL_TABLET | Freq: Every day | ORAL | 1 refills | Status: DC

## 2018-04-22 MED ORDER — IBUPROFEN 800 MG PO TABS: 800.0000 mg | ORAL_TABLET | Freq: Four times a day (QID) | ORAL | 0 refills | Status: DC | PRN

## 2018-04-22 MED ORDER — AMLODIPINE BESYLATE 5 MG PO TABS: 5.0000 mg | ORAL_TABLET | Freq: Every day | ORAL | 1 refills | Status: DC

## 2018-04-22 MED ORDER — CYCLOBENZAPRINE HCL 5 MG PO TABS: 5.0000 mg | ORAL_TABLET | Freq: Three times a day (TID) | ORAL | 0 refills | Status: DC | PRN

## 2018-04-22 MED ORDER — FLUTICASONE PROPIONATE 50 MCG/ACT NA SUSP: 2.0000 | Freq: Every day | NASAL | 3 refills | Status: DC

## 2018-04-22 NOTE — Progress Notes (Signed)
MRN: [id number] DOB: 02/08/1986  Subjective:   Alexis Donaldson is a 32 y.o. female presenting for follow up on chronic medical conditions.   HTN: Currently managed with amlodipine 62m daily. Patient is not checking blood pressure at home frequently, occasional readings of 118/79ish. Denies lightheadedness, dizziness, chronic headache, double vision, chest pain, shortness of breath, heart racing, palpitations, nausea, vomiting, abdominal pain, hematuria, lower leg swelling. Lifestyle: Has gained weight.  Believes this is due to the fact that she used to not have a car to drive but now that she does she is not walking as much.  She used to use app such as my fitness pal to help with diet but has not done a very long time.  She skips breakfast in the morning.  Will eat late at night.  Will also wake up in the middles of the night around 2-4 AM and snack on foods.  Drinks mostly water.    Seasonal allergies: Symptoms are very well controlled when she takes medication.  However she does not take them daily.  Needs refill for Zyrtec and Flonase.  Anxiety and depression: We will have to use hydroxyzine about 3-4 times per week.  Sometimes it helps she,  sometimes it does not. Does not want to be on a daily medication for this. Has first counseling appointment in about 3 weeks.  Denies SI or HI.  Back pain and muscle spasm:  Takes ibuprofen and Flexeril as needed.  Does not have to use often.  Back pain is relieved with exercise, which she has not been doing much of this point.  Will use heating pad. Denies midline tenderness, saddle anesthesia, bladder/bowel incontinence, numbness, tingling, weakness, hematuria, urinary frequency, and dysuria  ROS per HPI  KChandriahas a current medication list which includes the following prescription(s): albuterol, amlodipine, beclomethasone, blood pressure kit, cetirizine, cyclobenzaprine, fluticasone, hydroxyzine, ibuprofen, and levonorgestrel. Also is allergic to  effexor [venlafaxine] and zoloft [sertraline hcl].  KLexi has a past medical history of Allergy, Anxiety, Asthma, Cervical dysplasia, Depression, Hypertension, and STD (sexually transmitted disease). Also  has a past surgical history that includes Cervical biopsy; Wisdom tooth extraction; Gynecologic cryosurgery; Colposcopy; and Mirena.   Objective:   Vitals: BP 120/80 (BP Location: Left Arm, Patient Position: Sitting, Cuff Size: Normal)   Pulse 93   Temp 98.7 F (37.1 C) (Oral)   Ht 5' 2.99" (1.6 m)   Wt 219 lb (99.3 kg)   SpO2 100%   BMI 38.80 kg/m   Physical Exam  Constitutional: She is oriented to person, place, and time. She appears well-developed and well-nourished. No distress.  HENT:  Head: Normocephalic and atraumatic.  Mouth/Throat: Uvula is midline, oropharynx is clear and moist and mucous membranes are normal.  Eyes: Pupils are equal, round, and reactive to light. Conjunctivae and EOM are normal.  Neck: Normal range of motion.  Cardiovascular: Normal rate, regular rhythm, normal heart sounds and intact distal pulses.  Pulmonary/Chest: Effort normal and breath sounds normal. She has no wheezes. She has no rhonchi. She has no rales.  Musculoskeletal:       Right lower leg: She exhibits no swelling.       Left lower leg: She exhibits no swelling.  Neurological: She is alert and oriented to person, place, and time.  Skin: Skin is warm and dry.  Psychiatric: She has a normal mood and affect.  Vitals reviewed.   No results found for this or any previous visit (from the past  24 hour(s)).  Wt Readings from Last 3 Encounters:  04/22/18 219 lb (99.3 kg)  03/05/18 222 lb (100.7 kg)  11/02/17 215 lb 6.4 oz (97.7 kg)   BP Readings from Last 3 Encounters:  04/22/18 120/80  04/07/18 128/76  03/05/18 118/74    Assessment and Plan :  1. Essential hypertension Well-controlled.  Encouraged to check BP more frequently outside the office and document these values.  Labs  pending.  If remains at this value and patient starts to lose weight, consider lowering dose.  Follow-up at 6 months. - CBC with Differential/Platelet - CMP14+EGFR - Urinalysis, dipstick only - amLODipine (NORVASC) 5 MG tablet; Take 1 tablet (5 mg total) by mouth daily.  Dispense: 90 tablet; Refill: 1  2. Allergic rhinitis, unspecified seasonality, unspecified trigger - cetirizine (ZYRTEC) 10 MG tablet; Take 1 tablet (10 mg total) by mouth daily.  Dispense: 90 tablet; Refill: 1 - fluticasone (FLONASE) 50 MCG/ACT nasal spray; Place 2 sprays into both nostrils daily.  Dispense: 16 g; Refill: 3  3. Chronic bilateral low back pain without sciatica Refills provided. CMP pending.  - cyclobenzaprine (FLEXERIL) 5 MG tablet; Take 1 tablet (5 mg total) by mouth 3 (three) times daily as needed for muscle spasms.  Dispense: 60 tablet; Refill: 0 - ibuprofen (ADVIL,MOTRIN) 800 MG tablet; Take 1 tablet (800 mg total) by mouth every 6 (six) hours as needed (as needed for pain).  Dispense: 90 tablet; Refill: 0  4. Anxiety State Follow-up with counseling.  If no improvement or symptoms worsen please return to office for further evaluation.  5. Class 2 severe obesity with serious comorbidity and body mass index (BMI) of 38.0 to 38.9 in adult, unspecified obesity type ([place]) Discussed lifestyle modifications such as changes in diet and increase in exercise.  Recommended patient use app such as Control and instrumentation engineer or my fitness pal.  Recommend low-carb high-protein diet.  Stop eating after 7 PM at nighttime.  Eliminate snacking in middle of night.  Add protein shake for breakfast.  Start exercising and increase as tolerated.  Set goal of 19 lb weight loss over the next 6 months.   Tenna Delaine, PA-C  [facility] 04/22/2018 10:04 AM

## 2018-04-22 NOTE — Patient Instructions (Addendum)
For blood pressure, try to check your blood pressure at least a few times each week and document these values.  Goal is 130/80.  Continue with current medication regimen.  If you start to lose weight, we may need to decrease your medication.  For weight loss, I do recommend starting to do a low-carb high-protein diet.  You can use app such as my fitness pal or carb manager to help with this.  Also start exercising at least 3-5 times per week.  Start out with 10 minutes and increase to 20 to 30 minutes as tolerated.   Your back pain will likely start to improve with exercise and weight loss.  He can continue using ibuprofen and Flexeril as needed for pain.  For allergies, continue with Zyrtec and Flonase.  Look into accupuncture.    Follow up in 6 months.   Thank you for letting me participate in your health and well being.   IF you received an x-ray today, you will receive an invoice from [facility]. Please contact [facility] at [phone] with questions or concerns regarding your invoice.   IF you received labwork today, you will receive an invoice from [facility]. Please contact [facility] at [phone] with questions or concerns regarding your invoice.   Our billing staff will not be able to assist you with questions regarding bills from these companies.  You will be contacted with the lab results as soon as they are available. The fastest way to get your results is to activate your My Chart account. Instructions are located on the last page of this paperwork. If you have not heard from [place] regarding the results in 2 weeks, please contact this office.

## 2018-04-23 LAB — URINALYSIS, DIPSTICK ONLY
BILIRUBIN UA: NEGATIVE
Glucose, UA: NEGATIVE
Ketones, UA: NEGATIVE
Leukocytes, UA: NEGATIVE
NITRITE UA: NEGATIVE
PH UA: 6 (ref 5.0–7.5)
PROTEIN UA: NEGATIVE
RBC UA: NEGATIVE
SPEC GRAV UA: 1.018 (ref 1.005–1.030)
Urobilinogen, Ur: 0.2 mg/dL (ref 0.2–1.0)

## 2018-04-23 LAB — CBC WITH DIFFERENTIAL/PLATELET
BASOS ABS: 0 10*3/uL (ref 0.0–0.2)
Basos: 1 %
EOS (ABSOLUTE): 0.2 10*3/uL (ref 0.0–0.4)
Eos: 3 %
HEMATOCRIT: 40.9 % (ref 34.0–46.6)
HEMOGLOBIN: 13.2 g/dL (ref 11.1–15.9)
IMMATURE GRANULOCYTES: 0 %
Immature Grans (Abs): 0 10*3/uL (ref 0.0–0.1)
LYMPHS: 40 %
Lymphocytes Absolute: 2.2 10*3/uL (ref 0.7–3.1)
MCH: 26.8 pg (ref 26.6–33.0)
MCHC: 32.3 g/dL (ref 31.5–35.7)
MCV: 83 fL (ref 79–97)
Monocytes Absolute: 0.5 10*3/uL (ref 0.1–0.9)
Monocytes: 8 %
NEUTROS PCT: 48 %
Neutrophils Absolute: 2.6 10*3/uL (ref 1.4–7.0)
Platelets: 407 10*3/uL (ref 150–450)
RBC: 4.93 x10E6/uL (ref 3.77–5.28)
RDW: 14.3 % (ref 12.3–15.4)
WBC: 5.5 10*3/uL (ref 3.4–10.8)

## 2018-04-23 LAB — CMP14+EGFR
ALBUMIN: 4.3 g/dL (ref 3.5–5.5)
ALK PHOS: 85 IU/L (ref 39–117)
ALT: 30 IU/L (ref 0–32)
AST: 24 IU/L (ref 0–40)
Albumin/Globulin Ratio: 1.2 (ref 1.2–2.2)
BUN / CREAT RATIO: 9 (ref 9–23)
BUN: 6 mg/dL (ref 6–20)
Bilirubin Total: 0.3 mg/dL (ref 0.0–1.2)
CALCIUM: 9.7 mg/dL (ref 8.7–10.2)
CO2: 21 mmol/L (ref 20–29)
CREATININE: 0.69 mg/dL (ref 0.57–1.00)
Chloride: 101 mmol/L (ref 96–106)
GFR calc Af Amer: 133 mL/min/{1.73_m2} (ref >59)
GFR, EST NON AFRICAN AMERICAN: 116 mL/min/{1.73_m2} (ref >59)
GLOBULIN, TOTAL: 3.6 g/dL (ref 1.5–4.5)
GLUCOSE: 91 mg/dL (ref 65–99)
Potassium: 4.6 mmol/L (ref 3.5–5.2)
SODIUM: 137 mmol/L (ref 134–144)
Total Protein: 7.9 g/dL (ref 6.0–8.5)

## 2018-04-26 ENCOUNTER — Encounter: Payer: Self-pay | Admitting: Physician Assistant

## 2018-04-27 ENCOUNTER — Encounter: Payer: Self-pay | Admitting: Physician Assistant

## 2018-05-03 ENCOUNTER — Ambulatory Visit: Payer: Managed Care, Other (non HMO) | Admitting: Physician Assistant

## 2018-05-11 ENCOUNTER — Ambulatory Visit: Payer: BLUE CROSS/BLUE SHIELD | Admitting: Gynecology

## 2018-05-11 ENCOUNTER — Encounter: Payer: Self-pay | Admitting: Gynecology

## 2018-05-11 VITALS — BP 124/80

## 2018-05-11 DIAGNOSIS — Z30431 Encounter for routine checking of intrauterine contraceptive device: Secondary | ICD-10-CM

## 2018-05-11 NOTE — Patient Instructions (Signed)
Follow-up next year when due for annual exam, sooner if any issues.

## 2018-05-11 NOTE — Progress Notes (Signed)
    Alexis Donaldson [birth year] [id number]        32 y.o.  G0P0000 presents for IUD follow-up.  Had replacement of her Mirena IUD 04/07/2018.  Has done well since then without bleeding or pain.  Past medical history,surgical history, problem list, medications, allergies, family history and social history were all reviewed and documented in the EPIC chart.  Directed ROS with pertinent positives and negatives documented in the history of present illness/assessment and plan.  Exam: [name] assistant Vitals:   05/11/18 1536  BP: 124/80   General appearance:  Normal Abdomen soft nontender without masses guarding rebound Pelvic external BUS vagina normal.  Cervix normal.  IUD string visualized and appropriate length.  Uterus normal size midline mobile nontender.  Adnexa without masses or tenderness.  Assessment/Plan:  32 y.o. G0P0000 with normal IUD follow-up exam.  Assuming she continues well then she will follow-up next year when she is due for her annual exam.  Sooner if any issues.    Anastasio Auerbach MD, 4:09 PM 05/11/2018

## 2018-08-16 ENCOUNTER — Other Ambulatory Visit: Payer: Self-pay

## 2018-08-16 ENCOUNTER — Encounter: Payer: Self-pay | Admitting: Physician Assistant

## 2018-08-16 ENCOUNTER — Ambulatory Visit: Payer: BLUE CROSS/BLUE SHIELD | Admitting: Physician Assistant

## 2018-08-16 VITALS — BP 145/98 | HR 79 | Temp 98.4°F | Resp 20 | Ht 62.8 in | Wt 210.0 lb

## 2018-08-16 DIAGNOSIS — I10 Essential (primary) hypertension: Secondary | ICD-10-CM

## 2018-08-16 DIAGNOSIS — Z202 Contact with and (suspected) exposure to infections with a predominantly sexual mode of transmission: Secondary | ICD-10-CM | POA: Diagnosis not present

## 2018-08-16 DIAGNOSIS — Z113 Encounter for screening for infections with a predominantly sexual mode of transmission: Secondary | ICD-10-CM | POA: Diagnosis not present

## 2018-08-16 LAB — POCT URINE PREGNANCY: Preg Test, Ur: NEGATIVE

## 2018-08-16 NOTE — Patient Instructions (Addendum)
In terms of elevated blood pressure, start medication daily.  I would like you to check your blood pressure at least a couple times over the next week outside of the office and document these values. It is best if you check the blood pressure at different times in the day. Your goal is <140/90. If your values are consistently above this goal, please return to office for further evaluation. If you start to have chest pain, blurred vision, shortness of breath, severe headache, lower leg swelling, or nausea/vomiting please seek care immediately here or at the ED.   If you have lab work done today you will be contacted with your lab results within the next 2 weeks.  If you have not heard from [place] then please contact us. The fastest way to get your results is to register for My Chart.   Safe Sex Practicing safe sex means taking steps before and during sex to reduce your risk of:  Getting an STD (sexually transmitted disease).  Giving your partner an STD.  Unwanted pregnancy.  How can I practice safe sex?  To practice safe sex:  Limit your sexual partners to only one partner who is having sex with only you.  Avoid using alcohol and recreational drugs before having sex. These substances can affect your judgment.  Before having sex with a new partner: ? Talk to your partner about past partners, past STDs, and drug use. ? You and your partner should be screened for STDs and discuss the results with each other.  Check your body regularly for sores, blisters, rashes, or unusual discharge. If you notice any of these problems, visit your health care provider.  If you have symptoms of an infection or you are being treated for an STD, avoid sexual contact.  While having sex, use a condom. Make sure to: ? Use a condom every time you have vaginal, oral, or anal sex. Both females and males should wear condoms during oral sex. ? Keep condoms in place from the beginning to the end of sexual  activity. ? Use a latex condom, if possible. Latex condoms offer the best protection. ? Use only water-based lubricants or oils to lubricate a condom. Using petroleum-based lubricants or oils will weaken the condom and increase the chance that it will break.  See your health care provider for regular screenings, exams, and tests for STDs.  Talk with your health care provider about the form of birth control (contraception) that is best for you.  Get vaccinated against hepatitis B and human papillomavirus (HPV).  If you are at risk of being infected with HIV (human immunodeficiency virus), talk with your health care provider about taking a prescription medicine to prevent HIV infection. You are considered at risk for HIV if: ? You are a man who has sex with other men. ? You are a heterosexual man or woman who is sexually active with more than one partner. ? You take drugs by injection. ? You are sexually active with a partner who has HIV.  This information is not intended to replace advice given to you by your health care provider. Make sure you discuss any questions you have with your health care provider. Document Released: 10/30/2004 Document Revised: 02/06/2016 Document Reviewed: 08/12/2015 Elsevier Interactive Patient Education  2018 [employer].  IF you received an x-ray today, you will receive an invoice from [facility]. Please contact [facility] at [phone] with questions or concerns regarding your invoice.   IF you received labwork  today, you will receive an invoice from [facility]. Please contact [facility] at [phone] with questions or concerns regarding your invoice.   Our billing staff will not be able to assist you with questions regarding bills from these companies.  You will be contacted with the lab results as soon as they are available. The fastest way to get your results is to activate your My Chart account. Instructions are located on the last  page of this paperwork. If you have not heard from [place] regarding the results in 2 weeks, please contact this office.

## 2018-08-16 NOTE — Progress Notes (Signed)
Alexis Donaldson  MRN: [id number] DOB: [birth year]  Subjective:  Alexis Donaldson is a 32 y.o. female seen in office today for a chief complaint of need of STD testing.  2 recent sexual encounters with new partners, did not use condoms.  No known exposure.  Has had many lifetime partners.  Last STD testing was in May. +HSV in 10/2017.  Asymptomatic.  Denies dysuria, urinary frequency, vaginal discharge, genital irritation, pelvic pain, nausea, vomiting, fever, chills.  Has IUD in place for contraception.  Does not have frequent menstrual cycles.  In terms of blood pressure, she has not been taking her blood pressure medication regularly.  Denies chest pain, shortness of breath, nausea, vomiting, abdominal pain, headaches, lower leg swelling, and visual disturbance.  Review of Systems  Per HPI  Patient Active Problem List   Diagnosis Date Noted  . Driving safety issue 01/19/2017  . Mild persistent asthma 01/19/2017    Current Outpatient Medications on File Prior to Visit  Medication Sig Dispense Refill  . albuterol (PROVENTIL HFA;VENTOLIN HFA) 108 (90 Base) MCG/ACT inhaler Inhale 2 puffs into the lungs every 4 (four) hours as needed for wheezing or shortness of breath (cough, shortness of breath or wheezing.). 1 Inhaler 1  . amLODipine (NORVASC) 5 MG tablet Take 1 tablet (5 mg total) by mouth daily. 90 tablet 1  . beclomethasone (QVAR) 40 MCG/ACT inhaler Inhale into the lungs 2 (two) times daily.    . Blood Pressure Monitoring (BLOOD PRESSURE KIT) DEVI 1 Units by Does not apply route daily. 1 Device 0  . cetirizine (ZYRTEC) 10 MG tablet Take 1 tablet (10 mg total) by mouth daily. 90 tablet 1  . cyclobenzaprine (FLEXERIL) 5 MG tablet Take 1 tablet (5 mg total) by mouth 3 (three) times daily as needed for muscle spasms. 60 tablet 0  . fluticasone (FLONASE) 50 MCG/ACT nasal spray Place 2 sprays into both nostrils daily. 16 g 3  . hydrOXYzine (ATARAX/VISTARIL) 25 MG tablet Take 1-2 tablets (25-50 mg  total) by mouth 3 (three) times daily as needed. 90 tablet 1  . ibuprofen (ADVIL,MOTRIN) 800 MG tablet Take 1 tablet (800 mg total) by mouth every 6 (six) hours as needed (as needed for pain). 90 tablet 0  . levonorgestrel (MIRENA) 20 MCG/24HR IUD by Intrauterine route.     No current facility-administered medications on file prior to visit.     Allergies  Allergen Reactions  . Effexor [Venlafaxine] Anxiety  . Zoloft [Sertraline Hcl] Anxiety and Other (See Comments)     Objective:  BP (!) 145/98   Pulse 79   Temp 98.4 F (36.9 C) (Oral)   Resp 20   Ht 5' 2.8" (1.595 m)   Wt 210 lb (95.3 kg)   SpO2 98%   BMI 37.44 kg/m   Physical Exam  Constitutional: She is oriented to person, place, and time. She appears well-developed and well-nourished.  HENT:  Head: Normocephalic and atraumatic.  Eyes: Conjunctivae are normal.  Neck: Normal range of motion.  Pulmonary/Chest: Effort normal.  Neurological: She is alert and oriented to person, place, and time.  Skin: Skin is warm and dry.  Psychiatric: She has a normal mood and affect.  Vitals reviewed.   Results for orders placed or performed in visit on 08/16/18 (from the past 24 hour(s))  POCT urine pregnancy     Status: None   Collection Time: 08/16/18  2:25 PM  Result Value Ref Range   Preg Test, Ur Negative Negative   Wt  Readings from Last 3 Encounters:  08/16/18 210 lb (95.3 kg)  04/22/18 219 lb (99.3 kg)  03/05/18 222 lb (100.7 kg)   BP Readings from Last 3 Encounters:  08/16/18 (!) 145/98  05/11/18 124/80  04/22/18 120/80     Assessment and Plan :  1. Potential exposure to STD Asx. Labs pending.  Discussed safe sex practices. - GC/Chlamydia Probe Amp - Hepatitis panel, acute - HIV Antibody (routine testing w rflx) - RPR - Trichomonas vaginalis, RNA - POCT urine pregnancy  2. Essential hypertension Asymptomatic.  Has not taken blood pressure medication today.  Typically well controlled in office when she is  on her blood pressure medication.  Encouraged patient to start taking blood pressure medication daily as prescribed.  Instructed to check bp outside of office over the next couple of weeks. Return if consistently >140/90. Given strict ED precautions.      Tenna Delaine PA-C  [facility] 08/16/2018 2:15 PM

## 2018-08-17 LAB — HEPATITIS PANEL, ACUTE
HEP B C IGM: NEGATIVE
HEP C VIRUS AB: 0.2 {s_co_ratio} (ref 0.0–0.9)
Hep A IgM: NEGATIVE
Hepatitis B Surface Ag: NEGATIVE

## 2018-08-17 LAB — HIV ANTIBODY (ROUTINE TESTING W REFLEX): HIV SCREEN 4TH GENERATION: NONREACTIVE

## 2018-08-17 LAB — RPR: RPR: NONREACTIVE

## 2018-08-18 LAB — GC/CHLAMYDIA PROBE AMP
Chlamydia trachomatis, NAA: NEGATIVE
Neisseria gonorrhoeae by PCR: NEGATIVE

## 2018-08-18 LAB — TRICHOMONAS VAGINALIS, PROBE AMP: Trich vag by NAA: NEGATIVE

## 2018-09-03 ENCOUNTER — Telehealth: Payer: Self-pay | Admitting: Emergency Medicine

## 2018-09-03 NOTE — Telephone Encounter (Signed)
My Chart Msg

## 2018-10-19 ENCOUNTER — Ambulatory Visit: Payer: BLUE CROSS/BLUE SHIELD | Admitting: Emergency Medicine

## 2018-11-24 ENCOUNTER — Encounter: Payer: Self-pay | Admitting: Gynecology

## 2018-11-24 ENCOUNTER — Ambulatory Visit: Payer: BLUE CROSS/BLUE SHIELD | Admitting: Gynecology

## 2018-11-24 VITALS — BP 124/82

## 2018-11-24 DIAGNOSIS — R102 Pelvic and perineal pain: Secondary | ICD-10-CM | POA: Diagnosis not present

## 2018-11-24 DIAGNOSIS — Z30431 Encounter for routine checking of intrauterine contraceptive device: Secondary | ICD-10-CM | POA: Diagnosis not present

## 2018-11-24 NOTE — Telephone Encounter (Signed)
To be absolutely positively sure using condoms as backup contraception until ultrasound documents IUD placement.

## 2018-11-24 NOTE — Progress Notes (Signed)
    Alexis Donaldson 1986/07/07 [id number]        32 y.o.  G0P0000 presents with right lower quadrant pain starting 2 weeks ago after a coughing spell.  Has persisted since then.  No nausea vomiting diarrhea constipation.  No urinary symptoms such as frequency dysuria urgency.  No fever or chills.  Also she has been unable to feel the IUD string that she felt in the past.  Having no menses but occasional spotting every several months.  No pelvic pain as far as cramping.  Past medical history,surgical history, problem list, medications, allergies, family history and social history were all reviewed and documented in the EPIC chart.  Directed ROS with pertinent positives and negatives documented in the history of present illness/assessment and plan.  Exam: [name] assistant Vitals:   11/24/18 1156  BP: 124/82   General appearance:  Normal Abdomen soft nontender without masses guarding rebound Pelvic external BUS vagina normal.  Cervix normal.  IUD string not visualized.  Uterus normal size midline mobile nontender.  Adnexa without masses or tenderness.  Cervix was visualized with the colposcope and using the endocervical speculum I was unable to visualize the IUD string.  Assessment/Plan:  33 y.o. G0P0000 with nonvisualization of the IUD string.  Right lower quadrant pain following coughing.  Discussed differential to include muscle pull, GYN versus non-GYN such as GI.  Ovarian cyst possibilities also reviewed.  Recommend ultrasound both for IUD location/documentation and pelvic surveillance rule out ovarian cyst or other process.  Patient will schedule and follow-up for this.    Anastasio Auerbach MD, 12:08 PM 11/24/2018

## 2018-11-24 NOTE — Patient Instructions (Signed)
Follow-up for the ultrasound as scheduled. 

## 2018-12-16 ENCOUNTER — Ambulatory Visit: Payer: BLUE CROSS/BLUE SHIELD | Admitting: Gynecology

## 2018-12-16 ENCOUNTER — Other Ambulatory Visit: Payer: Self-pay

## 2018-12-16 ENCOUNTER — Ambulatory Visit (INDEPENDENT_AMBULATORY_CARE_PROVIDER_SITE_OTHER): Payer: BLUE CROSS/BLUE SHIELD

## 2018-12-16 ENCOUNTER — Other Ambulatory Visit: Payer: BLUE CROSS/BLUE SHIELD

## 2018-12-16 ENCOUNTER — Encounter: Payer: Self-pay | Admitting: Gynecology

## 2018-12-16 VITALS — BP 130/84

## 2018-12-16 DIAGNOSIS — R102 Pelvic and perineal pain: Secondary | ICD-10-CM | POA: Diagnosis not present

## 2018-12-16 DIAGNOSIS — Z30431 Encounter for routine checking of intrauterine contraceptive device: Secondary | ICD-10-CM

## 2018-12-16 DIAGNOSIS — D219 Benign neoplasm of connective and other soft tissue, unspecified: Secondary | ICD-10-CM

## 2018-12-16 DIAGNOSIS — D259 Leiomyoma of uterus, unspecified: Secondary | ICD-10-CM

## 2018-12-16 NOTE — Patient Instructions (Signed)
Follow-up when you are due for your annual exam

## 2018-12-16 NOTE — Progress Notes (Signed)
    Alexis Donaldson December 26, 1985 [id number]        32 y.o.  G0P0000 presents for ultrasound due to history of right sided pain.  Also for IUD check.  Past medical history,surgical history, problem list, medications, allergies, family history and social history were all reviewed and documented in the EPIC chart.  Directed ROS with pertinent positives and negatives documented in the history of present illness/assessment and plan.  Exam: Vitals:   12/16/18 1215  BP: 130/84   General appearance:  Normal  Ultrasound transvaginal shows uterus normal size and echotexture.  Small submucosal myoma 18 x 8 mm adjacent to the IUD.  IUD in normal position within the endometrium.  Right and left ovaries normal.  Cul-de-sac negative.  Assessment/Plan:  33 y.o. G0P0000 with right-sided pain.  Ultrasound is normal with the exception of a small myoma abutting the endometrial cavity.  We discussed leiomyoma.  She is asymptomatic from this and at this point we will plan on expectant management.  Right sided pain which I think is musculoskeletal.  She is been to monitor this for now and follow-up if it continues to be an issue.  Otherwise she will follow-up when due for her annual exam.    Anastasio Auerbach MD, 1:03 PM 12/16/2018

## 2018-12-29 ENCOUNTER — Ambulatory Visit: Payer: Self-pay | Admitting: Physician Assistant

## 2018-12-29 NOTE — Telephone Encounter (Signed)
I want to be on the safe side.   I work in the [employer].   I'm having bad allergy issues.    Today I woke up with a cough and sore throat.  The pollen bothers me this time of year.    I took a Zyrtec yesterday  and it helped.   I have not taken it today.   No fever.  No vomiting or diarrhea.   No shortness of breath or difficulty breathing.     I do have asthma by history.   Last summer I had asthma flare.    If I have an anxiety attack I can control it but sometimes it makes me feel like I'm having an asthma attach.    I went over the home advice with her.   I instructed her to call us back if the Zyrtec does not help after a couple of days or she develops fever.   She verbalized understanding and was agreeable to this plan.       Reason for Disposition . Causes of nasal allergies (allergens), questions about  Answer Assessment - Initial Assessment Questions 1. SYMPTOM: "What's the main symptom you're concerned about?" (e.g., runny nose, stuffiness, sneezing, itching)     I start getting a ear infection when my allergies act up.   I feel like this when my allergies act up.   I took a Zyrtec and it helped yesterday. 2. SEVERITY: "How bad is it?" "What does it keep you from doing?" (e.g., sleeping, working)      I can go to work.   No fever.  No body aches or chills. 3. EYES: "Are the eyes also red, watery, and itchy?"      Eyes are ok. 4. TRIGGER: "What pollen or other allergic substance do you think is causing the symptoms?"      Pollen 5. TREATMENT: "What medicine are you using?" "What medicine worked best in the past?"     Zyrtec.    6. OTHER SYMPTOMS: "Do you have any other symptoms?" (e.g., coughing, difficulty breathing, wheezing)     Coughing this morning which is dry. 7. PREGNANCY: "Is there any chance you are pregnant?" "When was your last menstrual period?"     I have an IUD  Protocols used: NASAL ALLERGIES (HAY FEVER)-A-AH

## 2018-12-30 ENCOUNTER — Other Ambulatory Visit: Payer: Self-pay | Admitting: Physician Assistant

## 2018-12-30 DIAGNOSIS — G8929 Other chronic pain: Secondary | ICD-10-CM

## 2018-12-30 DIAGNOSIS — M545 Low back pain: Principal | ICD-10-CM

## 2019-01-03 ENCOUNTER — Telehealth (INDEPENDENT_AMBULATORY_CARE_PROVIDER_SITE_OTHER): Payer: BLUE CROSS/BLUE SHIELD | Admitting: Emergency Medicine

## 2019-01-03 ENCOUNTER — Other Ambulatory Visit: Payer: Self-pay

## 2019-01-03 DIAGNOSIS — F419 Anxiety disorder, unspecified: Secondary | ICD-10-CM | POA: Diagnosis not present

## 2019-01-03 DIAGNOSIS — J309 Allergic rhinitis, unspecified: Secondary | ICD-10-CM | POA: Diagnosis not present

## 2019-01-03 DIAGNOSIS — M545 Low back pain, unspecified: Secondary | ICD-10-CM | POA: Insufficient documentation

## 2019-01-03 DIAGNOSIS — G8929 Other chronic pain: Secondary | ICD-10-CM | POA: Insufficient documentation

## 2019-01-03 DIAGNOSIS — J4530 Mild persistent asthma, uncomplicated: Secondary | ICD-10-CM

## 2019-01-03 DIAGNOSIS — I10 Essential (primary) hypertension: Secondary | ICD-10-CM

## 2019-01-03 MED ORDER — FLUTICASONE PROPIONATE 50 MCG/ACT NA SUSP: 2.0000 | Freq: Every day | NASAL | 3 refills | Status: DC

## 2019-01-03 MED ORDER — HYDROXYZINE HCL 25 MG PO TABS: 25.0000 mg | ORAL_TABLET | Freq: Three times a day (TID) | ORAL | 1 refills | Status: DC | PRN

## 2019-01-03 MED ORDER — CETIRIZINE HCL 10 MG PO TABS: 10.0000 mg | ORAL_TABLET | Freq: Every day | ORAL | 1 refills | Status: DC

## 2019-01-03 MED ORDER — CYCLOBENZAPRINE HCL 5 MG PO TABS: 5.0000 mg | ORAL_TABLET | Freq: Every day | ORAL | 0 refills | Status: DC | PRN

## 2019-01-03 MED ORDER — BECLOMETHASONE DIPROPIONATE 40 MCG/ACT IN AERS: 1.0000 | INHALATION_SPRAY | Freq: Two times a day (BID) | RESPIRATORY_TRACT | 1 refills | Status: DC

## 2019-01-03 NOTE — Progress Notes (Signed)
Telemedicine Encounter- SOAP NOTE Established Patient  This telephone encounter was conducted with the patient's (or proxy's) verbal consent via audio telecommunications: yes/no: Yes Patient was instructed to have this encounter in a suitably private space; and to only have persons present to whom they give permission to participate. In addition, patient identity was confirmed by use of name plus two identifiers (DOB and address).  I discussed the limitations, risks, security and privacy concerns of performing an evaluation and management service by telephone and the availability of in person appointments. I also discussed with the patient that there may be a patient responsible charge related to this service. The patient expressed understanding and agreed to proceed.  I spent a total of TIME; 0 MIN TO 60 MIN: 20 minutes talking with the patient or their proxy.  No chief complaint on file.   Subjective   Alexis Donaldson is a 33 y.o. female established patient. Telephone visit today to establish care PA used to see PA [facility]. Has the following chronic medical problems: 1.  Hypertension, on amlodipine, systolics 356-701 and diastolics in the 41C at home. 2.  Chronic anxiety.  Takes hydroxyzine as needed.  Increased anxiety due to present coronavirus situation. 3.  Chronic back pain.  Takes Flexeril as needed. Complaining today of increased anxiety.  No psychiatric evaluation in the past.  Has failed Effexor and Zoloft. No other complaints or medical concerns today.  HPI   Patient Active Problem List   Diagnosis Date Noted  . Mild persistent asthma 01/19/2017    Past Medical History:  Diagnosis Date  . Allergy   . Anxiety   . Asthma   . Cervical dysplasia   . Depression   . Hypertension   . STD (sexually transmitted disease)    Chlamydia X2,HSV    Current Outpatient Medications  Medication Sig Dispense Refill  . albuterol (PROVENTIL HFA;VENTOLIN HFA) 108 (90 Base) MCG/ACT  inhaler Inhale 2 puffs into the lungs every 4 (four) hours as needed for wheezing or shortness of breath (cough, shortness of breath or wheezing.). 1 Inhaler 1  . amLODipine (NORVASC) 5 MG tablet Take 1 tablet (5 mg total) by mouth daily. 90 tablet 1  . beclomethasone (QVAR) 40 MCG/ACT inhaler Inhale into the lungs 2 (two) times daily.    . Blood Pressure Monitoring (BLOOD PRESSURE KIT) DEVI 1 Units by Does not apply route daily. 1 Device 0  . cetirizine (ZYRTEC) 10 MG tablet Take 1 tablet (10 mg total) by mouth daily. 90 tablet 1  . cyclobenzaprine (FLEXERIL) 5 MG tablet Take 1 tablet (5 mg total) by mouth 3 (three) times daily as needed for muscle spasms. 60 tablet 0  . fluticasone (FLONASE) 50 MCG/ACT nasal spray Place 2 sprays into both nostrils daily. 16 g 3  . hydrOXYzine (ATARAX/VISTARIL) 25 MG tablet Take 1-2 tablets (25-50 mg total) by mouth 3 (three) times daily as needed. 90 tablet 1  . ibuprofen (ADVIL,MOTRIN) 800 MG tablet Take 1 tablet (800 mg total) by mouth every 6 (six) hours as needed (as needed for pain). 90 tablet 0  . levonorgestrel (MIRENA) 20 MCG/24HR IUD by Intrauterine route.     No current facility-administered medications for this visit.     Allergies  Allergen Reactions  . Effexor [Venlafaxine] Anxiety  . Zoloft [Sertraline Hcl] Anxiety and Other (See Comments)    Social History   Socioeconomic History  . Marital status: Single    Spouse name: Not on file  . Number of  children: 0  . Years of education: Not on file  . Highest education level: Not on file  Occupational History  . Not on file  Social Needs  . Financial resource strain: Not on file  . Food insecurity:    Worry: Not on file    Inability: Not on file  . Transportation needs:    Medical: Not on file    Non-medical: Not on file  Tobacco Use  . Smoking status: Former Smoker    Packs/day: 0.25    Years: 4.00    Pack years: 1.00    Types: Cigarettes  . Smokeless tobacco: Never Used   Substance and Sexual Activity  . Alcohol use: Yes    Alcohol/week: 8.0 standard drinks    Types: 4 Glasses of wine, 1 Cans of beer, 3 Shots of liquor per week  . Drug use: Yes    Types: Marijuana    Comment: Occas  . Sexual activity: Yes    Birth control/protection: I.U.D.    Comment: Mirena inserted 04/07/2018-1st intercourse 33 yo-More than 5 partners  Lifestyle  . Physical activity:    Days per week: Not on file    Minutes per session: Not on file  . Stress: Not on file  Relationships  . Social connections:    Talks on phone: Not on file    Gets together: Not on file    Attends religious service: Not on file    Active member of club or organization: Not on file    Attends meetings of clubs or organizations: Not on file    Relationship status: Not on file  . Intimate partner violence:    Fear of current or ex partner: Not on file    Emotionally abused: Not on file    Physically abused: Not on file    Forced sexual activity: Not on file  Other Topics Concern  . Not on file  Social History Narrative   Program supervisor    Review of Systems  Constitutional: Negative.  Negative for chills and fever.  HENT: Negative for sore throat.   Eyes: Negative for discharge and redness.  Respiratory: Negative for cough and shortness of breath.   Cardiovascular: Negative for chest pain and palpitations.  Gastrointestinal: Negative for abdominal pain, diarrhea, nausea and vomiting.  Musculoskeletal: Negative for myalgias.  Skin: Negative.  Negative for rash.  Neurological: Negative for dizziness and headaches.  All other systems reviewed and are negative.   Objective   Vitals as reported by the patient: As above There were no vitals filed for this visit.  There are no diagnoses linked to this encounter. Diagnoses and all orders for this visit:  Chronic anxiety -     hydrOXYzine (ATARAX/VISTARIL) 25 MG tablet; Take 1-2 tablets (25-50 mg total) by mouth 3 (three) times daily as  needed. -     Ambulatory referral to Psychiatry  Allergic rhinitis, unspecified seasonality, unspecified trigger -     cetirizine (ZYRTEC) 10 MG tablet; Take 1 tablet (10 mg total) by mouth daily. -     fluticasone (FLONASE) 50 MCG/ACT nasal spray; Place 2 sprays into both nostrils daily.  Chronic bilateral low back pain without sciatica -     cyclobenzaprine (FLEXERIL) 5 MG tablet; Take 1 tablet (5 mg total) by mouth daily as needed for muscle spasms.  Mild persistent asthma without complication -     beclomethasone (QVAR) 40 MCG/ACT inhaler; Inhale 1 puff into the lungs 2 (two) times daily.  Essential hypertension  I discussed the assessment and treatment plan with the patient. The patient was provided an opportunity to ask questions and all were answered. The patient agreed with the plan and demonstrated an understanding of the instructions.   The patient was advised to call back or seek an in-person evaluation if the symptoms worsen or if the condition fails to improve as anticipated.  I provided 20 minutes of non-face-to-face time during this encounter.  Horald Pollen, MD  Primary Care at [facility]

## 2019-01-04 MED ORDER — IBUPROFEN 800 MG PO TABS: 800.0000 mg | ORAL_TABLET | Freq: Four times a day (QID) | ORAL | 0 refills | Status: DC | PRN

## 2019-01-24 ENCOUNTER — Telehealth: Payer: Self-pay | Admitting: Emergency Medicine

## 2019-01-24 NOTE — Telephone Encounter (Signed)
Pt has upcoming appt on 01/27/2019 with provider and request for stronger allergy med and pain meds can be discussed at this visit. Dgaddy, CMA

## 2019-01-24 NOTE — Telephone Encounter (Signed)
Pt was told by  provider on last visit that he was not able tp prescribe any thing new ,with out seeing pt . Wants to know if he is willing to prescribe stronger allergy medicine or pain meds.   FR

## 2019-01-26 ENCOUNTER — Ambulatory Visit: Payer: Self-pay

## 2019-01-26 ENCOUNTER — Ambulatory Visit: Payer: Self-pay | Admitting: Emergency Medicine

## 2019-01-27 ENCOUNTER — Telehealth: Payer: BLUE CROSS/BLUE SHIELD | Admitting: Emergency Medicine

## 2019-01-31 ENCOUNTER — Encounter: Payer: Self-pay | Admitting: Emergency Medicine

## 2019-01-31 ENCOUNTER — Other Ambulatory Visit: Payer: Self-pay

## 2019-01-31 ENCOUNTER — Ambulatory Visit: Payer: BLUE CROSS/BLUE SHIELD | Admitting: Emergency Medicine

## 2019-01-31 VITALS — BP 146/90 | HR 86 | Temp 98.7°F | Resp 16 | Ht 63.0 in | Wt 201.0 lb

## 2019-01-31 DIAGNOSIS — I10 Essential (primary) hypertension: Secondary | ICD-10-CM | POA: Diagnosis not present

## 2019-01-31 DIAGNOSIS — J302 Other seasonal allergic rhinitis: Secondary | ICD-10-CM

## 2019-01-31 DIAGNOSIS — G4700 Insomnia, unspecified: Secondary | ICD-10-CM | POA: Diagnosis not present

## 2019-01-31 DIAGNOSIS — G4709 Other insomnia: Secondary | ICD-10-CM | POA: Insufficient documentation

## 2019-01-31 MED ORDER — MONTELUKAST SODIUM 10 MG PO TABS: 10.0000 mg | ORAL_TABLET | Freq: Every day | ORAL | 3 refills | Status: DC

## 2019-01-31 MED ORDER — AMLODIPINE BESYLATE 5 MG PO TABS: 5.0000 mg | ORAL_TABLET | Freq: Every day | ORAL | 3 refills | Status: DC

## 2019-01-31 NOTE — Progress Notes (Signed)
Alexis Donaldson °33 y.o. ° ° °Chief Complaint  °Patient presents with  °• Allergies  °  to discuss medication for seasonal allergy  °• Insomnia  °  per patient problem for several years  °• Medication Refill  °  Amlodipine  ° ° °HISTORY OF PRESENT ILLNESS: °This is a 33 y.o. female complaining of chronic seasonal allergies acting up.  Taking Zyrtec and Flonase but still having symptoms.  Also complaining of chronic insomnia.  Has a history of hypertension and needs a refill on amlodipine.  No other complaints or medical concerns today. °BP Readings from Last 3 Encounters:  °01/31/19 (!) 146/101  °12/16/18 130/84  °11/24/18 124/82  ° ° ° °HPI ° ° °Prior to Admission medications   °Medication Sig Start Date End Date Taking? Authorizing Provider  °albuterol (PROVENTIL HFA;VENTOLIN HFA) 108 (90 Base) MCG/ACT inhaler Inhale 2 puffs into the lungs every 4 (four) hours as needed for wheezing or shortness of breath (cough, shortness of breath or wheezing.). 01/14/17  Yes Wiseman, Brittany D, PA-C  °amLODipine (NORVASC) 5 MG tablet Take 1 tablet (5 mg total) by mouth daily. 01/31/19  Yes Sagardia, Miguel Jose, MD  °beclomethasone (QVAR) 40 MCG/ACT inhaler Inhale 1 puff into the lungs 2 (two) times daily. 01/03/19  Yes Sagardia, Miguel Jose, MD  °Blood Pressure Monitoring (BLOOD PRESSURE KIT) DEVI 1 Units by Does not apply route daily. 12/12/16  Yes Wiseman, Brittany D, PA-C  °cetirizine (ZYRTEC) 10 MG tablet Take 1 tablet (10 mg total) by mouth daily. 01/03/19  Yes Sagardia, Miguel Jose, MD  °cyclobenzaprine (FLEXERIL) 5 MG tablet Take 1 tablet (5 mg total) by mouth daily as needed for muscle spasms. 01/03/19  Yes Sagardia, Miguel Jose, MD  °fluticasone (FLONASE) 50 MCG/ACT nasal spray Place 2 sprays into both nostrils daily. 01/03/19  Yes Sagardia, Miguel Jose, MD  °hydrOXYzine (ATARAX/VISTARIL) 25 MG tablet Take 1-2 tablets (25-50 mg total) by mouth 3 (three) times daily as needed. 01/03/19  Yes Sagardia, Miguel Jose, MD  °ibuprofen  (ADVIL,MOTRIN) 800 MG tablet Take 1 tablet (800 mg total) by mouth every 6 (six) hours as needed (as needed for pain). 01/04/19  Yes Sagardia, Miguel Jose, MD  °levonorgestrel (MIRENA) 20 MCG/24HR IUD by Intrauterine route.   Yes [provider]  °montelukast (SINGULAIR) 10 MG tablet Take 1 tablet (10 mg total) by mouth at bedtime. 01/31/19   Sagardia, Miguel Jose, MD  ° ° °Allergies  °Allergen Reactions  °• Effexor [Venlafaxine] Anxiety  °• Zoloft [Sertraline Hcl] Anxiety and Other (See Comments)  ° ° °Patient Active Problem List  ° Diagnosis Date Noted  °• Allergic rhinitis 01/03/2019  °• Chronic bilateral low back pain without sciatica 01/03/2019  °• Chronic anxiety 01/03/2019  °• Essential hypertension 01/03/2019  °• Mild persistent asthma 01/19/2017  ° ° °Past Medical History:  °Diagnosis Date  °• Allergy   °• Anxiety   °• Asthma   °• Cervical dysplasia   °• Depression   °• Hypertension   °• STD (sexually transmitted disease)   ° Chlamydia X2,HSV  ° ° °Past Surgical History:  °Procedure Laterality Date  °• CERVICAL BIOPSY    °• COLPOSCOPY    °• GYNECOLOGIC CRYOSURGERY    °• Mirena    ° Inserted 04/07/2018  °• WISDOM TOOTH EXTRACTION    ° ° °Social History  ° °Socioeconomic History  °• Marital status: Single  °  Spouse name: Not on file  °• Number of children: 0  °• Years of education: Not   on file  °• Highest education level: Not on file  °Occupational History  °• Not on file  °Social Needs  °• Financial resource strain: Not on file  °• Food insecurity:  °  Worry: Not on file  °  Inability: Not on file  °• Transportation needs:  °  Medical: Not on file  °  Non-medical: Not on file  °Tobacco Use  °• Smoking status: Former Smoker  °  Packs/day: 0.25  °  Years: 4.00  °  Pack years: 1.00  °  Types: Cigarettes  °• Smokeless tobacco: Never Used  °Substance and Sexual Activity  °• Alcohol use: Yes  °  Alcohol/week: 8.0 standard drinks  °  Types: 4 Glasses of wine, 1 Cans of beer, 3 Shots of liquor per week  °•  Drug use: Yes  °  Types: Marijuana  °  Comment: Occas  °• Sexual activity: Yes  °  Birth control/protection: I.U.D.  °  Comment: Mirena inserted 04/07/2018-1st intercourse 33 yo-More than 5 partners  °Lifestyle  °• Physical activity:  °  Days per week: Not on file  °  Minutes per session: Not on file  °• Stress: Not on file  °Relationships  °• Social connections:  °  Talks on phone: Not on file  °  Gets together: Not on file  °  Attends religious service: Not on file  °  Active member of club or organization: Not on file  °  Attends meetings of clubs or organizations: Not on file  °  Relationship status: Not on file  °• Intimate partner violence:  °  Fear of current or ex partner: Not on file  °  Emotionally abused: Not on file  °  Physically abused: Not on file  °  Forced sexual activity: Not on file  °Other Topics Concern  °• Not on file  °Social History Narrative  ° Program supervisor  ° ° °Family History  °Problem Relation Age of Onset  °• Hyperlipidemia Mother   °• Hypertension Mother   °• Hypertension Father   °• Heart attack Father   °• Mental illness Sister   °• Diabetes Maternal Grandmother   °• Cancer Maternal Grandmother   °     Lung  °• Cancer Maternal Grandfather   °     Lung  °• Hypertension Maternal Grandfather   °• Heart disease Maternal Grandfather   °• Mental illness Paternal Grandmother   °• Hypertension Paternal Grandfather   °• Stroke Paternal Grandfather   °• Mental illness Sister   ° ° ° °Review of Systems  °Constitutional: Negative.  Negative for chills and fever.  °HENT: Negative.   °Eyes: Negative.  Negative for blurred vision and double vision.  °Respiratory: Negative.  Negative for cough and shortness of breath.   °Cardiovascular: Negative.  Negative for chest pain and palpitations.  °Gastrointestinal: Negative.  Negative for abdominal pain, diarrhea, nausea and vomiting.  °Genitourinary: Negative for dysuria and hematuria.  °Musculoskeletal: Negative for myalgias.  °Skin: Negative.   Negative for rash.  °Neurological: Negative.  Negative for dizziness and headaches.  °Endo/Heme/Allergies: Negative.   °Psychiatric/Behavioral: The patient is nervous/anxious and has insomnia.   °All other systems reviewed and are negative. ° ° °Vitals:  ° 01/31/19 1456 01/31/19 1536  °BP: (!) 146/101 (!) 146/90  °Pulse: 86   °Resp: 16   °Temp: 98.7 °F (37.1 °C)   °SpO2: 98%   ° ° °Physical Exam °Vitals signs reviewed.  °Constitutional:   °     Appearance: Normal appearance.  °HENT:  °   Head: Normocephalic and atraumatic.  °   Right Ear: Tympanic membrane, ear canal and external ear normal.  °   Left Ear: Tympanic membrane, ear canal and external ear normal.  °   Nose: Nose normal.  °   Mouth/Throat:  °   Mouth: Mucous membranes are moist.  °   Pharynx: Oropharynx is clear.  °Eyes:  °   Extraocular Movements: Extraocular movements intact.  °   Conjunctiva/sclera: Conjunctivae normal.  °   Pupils: Pupils are equal, round, and reactive to light.  °Neck:  °   Musculoskeletal: Normal range of motion and neck supple.  °Cardiovascular:  °   Rate and Rhythm: Normal rate and regular rhythm.  °   Heart sounds: Normal heart sounds.  °Pulmonary:  °   Effort: Pulmonary effort is normal.  °   Breath sounds: Normal breath sounds.  °Musculoskeletal: Normal range of motion.  °Skin: °   General: Skin is warm and dry.  °   Capillary Refill: Capillary refill takes less than 2 seconds.  °Neurological:  °   General: No focal deficit present.  °   Mental Status: She is alert and oriented to person, place, and time.  °Psychiatric:     °   Mood and Affect: Mood normal.     °   Behavior: Behavior normal.  ° ° °A total of 25 minutes was spent in the room with the patient, greater than 50% of which was in counseling/coordination of care regarding chronic medical problems, treatment, medications, new medication, prognosis, and need for follow-up if no better or worse. ° ° °ASSESSMENT & PLAN: °Alexis Donaldson was seen today for allergies, insomnia and  medication refill. ° °Diagnoses and all orders for this visit: ° °Seasonal allergies ° °Essential hypertension °-     amLODipine (NORVASC) 5 MG tablet; Take 1 tablet (5 mg total) by mouth daily. ° °Insomnia, unspecified type ° °Other orders °-     montelukast (SINGULAIR) 10 MG tablet; Take 1 tablet (10 mg total) by mouth at bedtime. ° ° ° °Patient Instructions  ° ° ° ° ° °If you have lab work done today you will be contacted with your lab results within the next 2 weeks.  If you have not heard from us then please contact us. The fastest way to get your results is to register for My Chart. ° ° °IF you received an x-ray today, you will receive an invoice from [facility]. Please contact [facility] at [phone] with questions or concerns regarding your invoice.  ° °IF you received labwork today, you will receive an invoice from [facility]. Please contact [facility] at [phone] with questions or concerns regarding your invoice.  ° °Our billing staff will not be able to assist you with questions regarding bills from these companies. ° °You will be contacted with the lab results as soon as they are available. The fastest way to get your results is to activate your My Chart account. Instructions are located on the last page of this paperwork. If you have not heard from us regarding the results in 2 weeks, please contact this office. °  ° ° °Allergic Rhinitis, Adult °Allergic rhinitis is a reaction to allergens in the air. Allergens are tiny specks (particles) in the air that cause your body to have an allergic reaction. This condition cannot be passed from person to person (is not contagious). Allergic rhinitis cannot be cured, but it can be   controlled. There are two types of allergic rhinitis:  Seasonal. This type is also called hay fever. It happens only during certain times of the year.  Perennial. This type can happen at any time of the year. What are the causes? This condition may be  caused by:  Pollen from grasses, trees, and weeds.  House dust mites.  Pet dander.  Mold. What are the signs or symptoms? Symptoms of this condition include:  Sneezing.  Runny or stuffy nose (nasal congestion).  A lot of mucus in the back of the throat (postnasal drip).  Itchy nose.  Tearing of the eyes.  Trouble sleeping.  Being sleepy during day. How is this treated? There is no cure for this condition. You should avoid things that trigger your symptoms (allergens). Treatment can help to relieve symptoms. This may include:  Medicines that block allergy symptoms, such as antihistamines. These may be given as a shot, nasal spray, or pill.  Shots that are given until your body becomes less sensitive to the allergen (desensitization).  Stronger medicines, if all other treatments have not worked. Follow these instructions at home: Avoiding allergens   Find out what you are allergic to. Common allergens include smoke, dust, and pollen.  Avoid them if you can. These are some of the things that you can do to avoid allergens: ? Replace carpet with wood, tile, or vinyl flooring. Carpet can trap dander and dust. ? Clean any mold found in the home. ? Do not smoke. Do not allow smoking in your home. ? Change your heating and air conditioning filter at least once a month. ? During allergy season:  Keep windows closed as much as you can. If possible, use air conditioning when there is a lot of pollen in the air.  Use a special filter for allergies with your furnace and air conditioner.  Plan outdoor activities when pollen counts are lowest. This is usually during the early morning or evening hours.  If you do go outdoors when pollen count is high, wear a special mask for people with allergies.  When you come indoors, take a shower and change your clothes before sitting on furniture or bedding. General instructions  Do not use fans in your home.  Do not hang clothes  outside to dry.  Wear sunglasses to keep pollen out of your eyes.  Wash your hands right away after you touch household pets.  Take over-the-counter and prescription medicines only as told by your doctor.  Keep all follow-up visits as told by your doctor. This is important. Contact a doctor if:  You have a fever.  You have a cough that does not go away (is persistent).  You start to make whistling sounds when you breathe (wheeze).  Your symptoms do not get better with treatment.  You have thick fluid coming from your nose.  You start to have nosebleeds. Get help right away if:  Your tongue or your lips are swollen.  You have trouble breathing.  You feel dizzy or you feel like you are going to pass out (faint).  You have cold sweats. Summary  Allergic rhinitis is a reaction to allergens in the air.  This condition may be caused by allergens. These include pollen, dust mites, pet dander, and mold.  Symptoms include a runny, itchy nose, sneezing, or tearing eyes. You may also have trouble sleeping or feel sleepy during the day.  Treatment includes taking medicines and avoiding allergens. You may also get shots or take  stronger medicines. °· Get help if you have a fever or a cough that does not stop. Get help right away if you are short of breath. °This information is not intended to replace advice given to you by your health care provider. Make sure you discuss any questions you have with your health care provider. °Document Released: 01/22/2011 Document Revised: 04/13/2018 Document Reviewed: 04/13/2018 °Elsevier Interactive Patient Education © 2019 [employer] ° ° ° ° ° °Miguel Sagardia, MD °[facility] °[facility] °

## 2019-01-31 NOTE — Patient Instructions (Addendum)
If you have lab work done today you will be contacted with your lab results within the next 2 weeks.  If you have not heard from [place] then please contact us. The fastest way to get your results is to register for My Chart.   IF you received an x-ray today, you will receive an invoice from [facility]. Please contact [facility] at [phone] with questions or concerns regarding your invoice.   IF you received labwork today, you will receive an invoice from [facility]. Please contact [facility] at [phone] with questions or concerns regarding your invoice.   Our billing staff will not be able to assist you with questions regarding bills from these companies.  You will be contacted with the lab results as soon as they are available. The fastest way to get your results is to activate your My Chart account. Instructions are located on the last page of this paperwork. If you have not heard from [place] regarding the results in 2 weeks, please contact this office.     Allergic Rhinitis, Adult Allergic rhinitis is a reaction to allergens in the air. Allergens are tiny specks (particles) in the air that cause your body to have an allergic reaction. This condition cannot be passed from person to person (is not contagious). Allergic rhinitis cannot be cured, but it can be controlled. There are two types of allergic rhinitis:  Seasonal. This type is also called hay fever. It happens only during certain times of the year.  Perennial. This type can happen at any time of the year. What are the causes? This condition may be caused by:  Pollen from grasses, trees, and weeds.  House dust mites.  Pet dander.  Mold. What are the signs or symptoms? Symptoms of this condition include:  Sneezing.  Runny or stuffy nose (nasal congestion).  A lot of mucus in the back of the throat (postnasal drip).  Itchy nose.  Tearing of the eyes.  Trouble sleeping.  Being sleepy during  day. How is this treated? There is no cure for this condition. You should avoid things that trigger your symptoms (allergens). Treatment can help to relieve symptoms. This may include:  Medicines that block allergy symptoms, such as antihistamines. These may be given as a shot, nasal spray, or pill.  Shots that are given until your body becomes less sensitive to the allergen (desensitization).  Stronger medicines, if all other treatments have not worked. Follow these instructions at home: Avoiding allergens   Find out what you are allergic to. Common allergens include smoke, dust, and pollen.  Avoid them if you can. These are some of the things that you can do to avoid allergens: ? Replace carpet with wood, tile, or vinyl flooring. Carpet can trap dander and dust. ? Clean any mold found in the home. ? Do not smoke. Do not allow smoking in your home. ? Change your heating and air conditioning filter at least once a month. ? During allergy season:  Keep windows closed as much as you can. If possible, use air conditioning when there is a lot of pollen in the air.  Use a special filter for allergies with your furnace and air conditioner.  Plan outdoor activities when pollen counts are lowest. This is usually during the early morning or evening hours.  If you do go outdoors when pollen count is high, wear a special mask for people with allergies.  When you come indoors, take a shower and change your  clothes before sitting on furniture or bedding. General instructions  Do not use fans in your home.  Do not hang clothes outside to dry.  Wear sunglasses to keep pollen out of your eyes.  Wash your hands right away after you touch household pets.  Take over-the-counter and prescription medicines only as told by your doctor.  Keep all follow-up visits as told by your doctor. This is important. Contact a doctor if:  You have a fever.  You have a cough that does not go away (is  persistent).  You start to make whistling sounds when you breathe (wheeze).  Your symptoms do not get better with treatment.  You have thick fluid coming from your nose.  You start to have nosebleeds. Get help right away if:  Your tongue or your lips are swollen.  You have trouble breathing.  You feel dizzy or you feel like you are going to pass out (faint).  You have cold sweats. Summary  Allergic rhinitis is a reaction to allergens in the air.  This condition may be caused by allergens. These include pollen, dust mites, pet dander, and mold.  Symptoms include a runny, itchy nose, sneezing, or tearing eyes. You may also have trouble sleeping or feel sleepy during the day.  Treatment includes taking medicines and avoiding allergens. You may also get shots or take stronger medicines.  Get help if you have a fever or a cough that does not stop. Get help right away if you are short of breath. This information is not intended to replace advice given to you by your health care provider. Make sure you discuss any questions you have with your health care provider. Document Released: 01/22/2011 Document Revised: 04/13/2018 Document Reviewed: 04/13/2018 Elsevier Interactive Patient Education  2019 [employer].

## 2019-02-03 ENCOUNTER — Emergency Department
Admission: EM | Admit: 2019-02-03 | Discharge: 2019-02-03 | Disposition: A | Discharge status: Home / Self Care | Payer: BLUE CROSS/BLUE SHIELD | Attending: Emergency Medicine | Admitting: Emergency Medicine

## 2019-02-03 ENCOUNTER — Ambulatory Visit: Payer: Self-pay

## 2019-02-03 ENCOUNTER — Emergency Department: Payer: BLUE CROSS/BLUE SHIELD

## 2019-02-03 ENCOUNTER — Other Ambulatory Visit: Payer: Self-pay

## 2019-02-03 DIAGNOSIS — R51 Headache: Secondary | ICD-10-CM | POA: Diagnosis not present

## 2019-02-03 DIAGNOSIS — Z79899 Other long term (current) drug therapy: Secondary | ICD-10-CM | POA: Diagnosis not present

## 2019-02-03 DIAGNOSIS — I10 Essential (primary) hypertension: Secondary | ICD-10-CM | POA: Diagnosis not present

## 2019-02-03 DIAGNOSIS — R200 Anesthesia of skin: Secondary | ICD-10-CM | POA: Diagnosis not present

## 2019-02-03 DIAGNOSIS — J45909 Unspecified asthma, uncomplicated: Secondary | ICD-10-CM | POA: Diagnosis not present

## 2019-02-03 DIAGNOSIS — F419 Anxiety disorder, unspecified: Secondary | ICD-10-CM | POA: Diagnosis not present

## 2019-02-03 DIAGNOSIS — Z87891 Personal history of nicotine dependence: Secondary | ICD-10-CM | POA: Insufficient documentation

## 2019-02-03 DIAGNOSIS — R209 Unspecified disturbances of skin sensation: Secondary | ICD-10-CM

## 2019-02-03 DIAGNOSIS — R202 Paresthesia of skin: Secondary | ICD-10-CM | POA: Diagnosis not present

## 2019-02-03 HISTORY — DX: Herpesviral infection, unspecified: B00.9

## 2019-02-03 LAB — CBC
HCT: 44.4 % (ref 36.0–46.0)
Hemoglobin: 14.5 g/dL (ref 12.0–15.0)
MCH: 27.3 pg (ref 26.0–34.0)
MCHC: 32.7 g/dL (ref 30.0–36.0)
MCV: 83.5 fL (ref 80.0–100.0)
Platelets: 336 10*3/uL (ref 150–400)
RBC: 5.32 MIL/uL — ABNORMAL HIGH (ref 3.87–5.11)
RDW: 13 % (ref 11.5–15.5)
WBC: 4.7 10*3/uL (ref 4.0–10.5)
nRBC: 0 % (ref 0.0–0.2)

## 2019-02-03 LAB — BASIC METABOLIC PANEL
Anion gap: 8 (ref 5–15)
BUN: 8 mg/dL (ref 6–20)
CO2: 26 mmol/L (ref 22–32)
Calcium: 9.2 mg/dL (ref 8.9–10.3)
Chloride: 103 mmol/L (ref 98–111)
Creatinine, Ser: 0.74 mg/dL (ref 0.44–1.00)
GFR calc Af Amer: >60 mL/min (ref >60)
GFR calc non Af Amer: >60 mL/min (ref >60)
Glucose, Bld: 100 mg/dL — ABNORMAL HIGH (ref 70–99)
Potassium: 3.6 mmol/L (ref 3.5–5.1)
Sodium: 137 mmol/L (ref 135–145)

## 2019-02-03 MED ORDER — DIAZEPAM 5 MG PO TABS
5.0000 mg | ORAL_TABLET | Freq: Once | ORAL | Status: AC
  Administered 2019-02-03: 5 mg via ORAL
  Filled 2019-02-03: qty 1

## 2019-02-03 NOTE — ED Notes (Signed)
ED Provider at bedside. 

## 2019-02-03 NOTE — ED Triage Notes (Addendum)
Pt noticed right upper lip swelling last night, took benadryl prior to sleeping. Awakened this AM and right side of face feels numb. Intermittent right arm and right leg tingling that started at 11AM today. No weakness or numbness. Pt alert and oriented X4, active, cooperative, pt in NAD. RR even and unlabored, color WNL.  No stroke like sx.   Pt with hx of HSV1 and HSV2, does not take any medications. Pt took herself off of her BP medications X 2 months ago, restarted X 2 days ago.

## 2019-02-03 NOTE — Discharge Instructions (Addendum)
Please continue with blood pressure medications daily.  Continue to monitor and record blood pressures at home.  Return to the emergency department for any headaches, chest pain, shortness of breath, vision changes, weakness.  Continue with Vistaril as needed for anxiety.

## 2019-02-03 NOTE — Telephone Encounter (Signed)
Incoming call from Patient with a complaint about a swollen right top lip.  Started yesterday.  Reports( r) leg tingling  also.  Denies fever.  Takes Norvasc 5 mg.  Reviewed protocol and care advice.  Patient voices understanding.  Protocol recommends that she goes to ED or Urgent care. Patient voices understanding.                                                                                                                                                                                          Reason for Disposition . Taking an ACE Inhibitor medication  (e.g., benazepril/LOTENSIN, captopril/CAPOTEN, enalapril/VASOTEC, lisinopril/ZESTRIL)  Answer Assessment - Initial Assessment Questions 1. ONSET: "When did the swelling start?" (e.g., minutes, hours, days)     *No Answer* 2. LOCATION: "What part of the face is swollen?"     *No Answer* 3. SEVERITY: "How swollen is it?"     *No Answer* 4. ITCHING: "Is there any itching?" If so, ask: "How much?"   (Scale 1-10; mild, moderate or severe)     *No Answer* 5. PAIN: "Is the swelling painful to touch?" If so, ask: "How painful is it?"   (Scale 1-10; mild, moderate or severe)     *No Answer* 6. FEVER: "Do you have a fever?" If so, ask: "What is it, how was it measured, and when did it start?"      *No Answer* 7. CAUSE: "What do you think is causing the face swelling?"     *No Answer* 8. RECURRENT SYMPTOM: "Have you had face swelling before?" If so, ask: "When was the last time?" "What happened that time?"     *No Answer* 9. OTHER SYMPTOMS: "Do you have any other symptoms?" (e.g., toothache, leg swelling)     *No Answer* 10. PREGNANCY: "Is there any chance you are pregnant?" "When was your last menstrual period?"       Uses Iud  Protocols used: [facility]

## 2019-02-03 NOTE — ED Provider Notes (Signed)
[facility] EMERGENCY DEPARTMENT Provider Note   CSN: [id number] Arrival date & time: 02/03/19  1205    History   Chief Complaint Chief Complaint  Patient presents with  . Tingling    HPI Alexis Donaldson is a 33 y.o. female with a history of hypertension and anxiety presents to the emergency department for evaluation of right-sided facial numbness along with right arm and right knee numbness.  Patient noticed mild numbness in the right knee yesterday morning with right shoulder numbness around 955 this morning.  Patient also noticed numbness around the right upper lip and right eyebrow around 9 AM this morning.  Patient states the numbness has been intermittent.  She denies any headache, trauma or injury.  She has been off of her blood pressure medications for 2 months and recently started the medication 2 days ago.  She was noted to be hypertensive in the urgent care facility earlier this morning who sent her here for further evaluation.  Patient states her blood pressure was 195/125.  Patient does admit to feeling anxious.  She does not take any medication for her anxiety today.  She denies any vision changes, slurred speech, difficulty swallowing, weakness or headache.  No chest pain or shortness of breath.     HPI  Past Medical History:  Diagnosis Date  . Allergy   . Anxiety   . Asthma   . Cervical dysplasia   . Depression   . HSV-1 (herpes simplex virus 1) infection   . HSV-2 (herpes simplex virus 2) infection   . Hypertension   . STD (sexually transmitted disease)    Chlamydia X2,HSV    Patient Active Problem List   Diagnosis Date Noted  . Seasonal allergies 01/31/2019  . Insomnia 01/31/2019  . Allergic rhinitis 01/03/2019  . Chronic bilateral low back pain without sciatica 01/03/2019  . Chronic anxiety 01/03/2019  . Essential hypertension 01/03/2019  . Mild persistent asthma 01/19/2017    Past Surgical History:  Procedure Laterality Date  .  CERVICAL BIOPSY    . COLPOSCOPY    . GYNECOLOGIC CRYOSURGERY    . Mirena     Inserted 04/07/2018  . WISDOM TOOTH EXTRACTION       OB History    Gravida  0   Para  0   Term  0   Preterm  0   AB  0   Living  0     SAB  0   TAB  0   Ectopic  0   Multiple  0   Live Births  0            Home Medications    Prior to Admission medications   Medication Sig Start Date End Date Taking? Authorizing Provider  albuterol (PROVENTIL HFA;VENTOLIN HFA) 108 (90 Base) MCG/ACT inhaler Inhale 2 puffs into the lungs every 4 (four) hours as needed for wheezing or shortness of breath (cough, shortness of breath or wheezing.). 01/14/17  Yes Timmothy Euler, [place] D, PA-C  amLODipine (NORVASC) 5 MG tablet Take 1 tablet (5 mg total) by mouth daily. 01/31/19  Yes Sagardia, Ines Bloomer, MD  beclomethasone (QVAR) 40 MCG/ACT inhaler Inhale 1 puff into the lungs 2 (two) times daily. 01/03/19  Yes SagardiaInes Bloomer, MD  Blood Pressure Monitoring (BLOOD PRESSURE KIT) DEVI 1 Units by Does not apply route daily. 12/12/16  Yes Timmothy Euler, [place] D, PA-C  cetirizine (ZYRTEC) 10 MG tablet Take 1 tablet (10 mg total) by mouth daily. 01/03/19  Yes Sagardia, Ines Bloomer, MD  cyclobenzaprine (FLEXERIL) 5 MG tablet Take 1 tablet (5 mg total) by mouth daily as needed for muscle spasms. 01/03/19  Yes Sagardia, Ines Bloomer, MD  fluticasone [facility]) 50 MCG/ACT nasal spray Place 2 sprays into both nostrils daily. 01/03/19  Yes Sagardia, Ines Bloomer, MD  hydrOXYzine (ATARAX/VISTARIL) 25 MG tablet Take 1-2 tablets (25-50 mg total) by mouth 3 (three) times daily as needed. 01/03/19  Yes Sagardia, Ines Bloomer, MD  ibuprofen (ADVIL,MOTRIN) 800 MG tablet Take 1 tablet (800 mg total) by mouth every 6 (six) hours as needed (as needed for pain). 01/04/19  Yes Sagardia, Ines Bloomer, MD  levonorgestrel [facility]) 20 MCG/24HR IUD by Intrauterine route.    [provider]  montelukast (SINGULAIR) 10 MG tablet Take 1 tablet (10 mg  total) by mouth at bedtime. 01/31/19   Horald Pollen, MD    Family History Family History  Problem Relation Age of Onset  . Hyperlipidemia Mother   . Hypertension Mother   . Hypertension Father   . Heart attack Father   . Mental illness Sister   . Diabetes Maternal Grandmother   . Cancer Maternal Grandmother        Lung  . Cancer Maternal Grandfather        Lung  . Hypertension Maternal Grandfather   . Heart disease Maternal Grandfather   . Mental illness Paternal Grandmother   . Hypertension Paternal Grandfather   . Stroke Paternal Grandfather   . Mental illness Sister     Social History Social History   Tobacco Use  . Smoking status: Former Smoker    Packs/day: 0.25    Years: 4.00    Pack years: 1.00    Types: Cigarettes  . Smokeless tobacco: Never Used  Substance Use Topics  . Alcohol use: Yes    Alcohol/week: 8.0 standard drinks    Types: 4 Glasses of wine, 1 Cans of beer, 3 Shots of liquor per week  . Drug use: Yes    Types: Marijuana    Comment: Occas     Allergies   Effexor [venlafaxine] and Zoloft [sertraline hcl]   Review of Systems Review of Systems  Constitutional: Negative for chills, fatigue and fever.  Respiratory: Negative for shortness of breath.   Cardiovascular: Negative for chest pain.  Gastrointestinal: Negative for nausea and vomiting.  Genitourinary: Negative for dysuria.  Musculoskeletal: Negative for back pain, gait problem and joint swelling.  Skin: Negative for rash and wound.  Neurological: Positive for numbness. Negative for dizziness, seizures, syncope, speech difficulty, weakness, light-headedness and headaches.  Psychiatric/Behavioral: The patient is nervous/anxious.      Physical Exam Updated Vital Signs BP (!) 160/109 (BP Location: Right Arm)   Pulse 83   Temp 98.1 F (36.7 C) (Oral)   Resp 17   Ht '5\' 3"'  (1.6 m)   Wt 91 kg   SpO2 100%   BMI 35.54 kg/m   Physical Exam Vitals signs reviewed.   Constitutional:      General: She is not in acute distress.    Appearance: She is well-developed.  HENT:     Head: Normocephalic and atraumatic.     Comments: No facial symmetry.  Speaks full sentences.    Right Ear: External ear normal.     Left Ear: External ear normal.     Nose: Nose normal.  Eyes:     General:        Right eye: No discharge.  Left eye: No discharge.     Extraocular Movements: Extraocular movements intact.     Pupils: Pupils are equal, round, and reactive to light.  Neck:     Musculoskeletal: Normal range of motion and neck supple.  Cardiovascular:     Rate and Rhythm: Normal rate and regular rhythm.  Pulmonary:     Effort: Pulmonary effort is normal. No respiratory distress.     Breath sounds: Normal breath sounds.  Chest:     Chest wall: No tenderness.  Abdominal:     General: There is no distension.     Palpations: Abdomen is soft.     Tenderness: There is no abdominal tenderness.  Musculoskeletal: Normal range of motion.        General: No swelling or tenderness.     Comments: Full range of motion of the upper and lower extremities with no discomfort.  Strength 5 out of 5 in upper and lower extremities with no weakness.  Skin:    General: Skin is warm and dry.  Neurological:     General: No focal deficit present.     Mental Status: She is alert and oriented to person, place, and time. Mental status is at baseline.     Cranial Nerves: No cranial nerve deficit.     Sensory: No sensory deficit.     Motor: No weakness.     Coordination: Coordination is intact. Romberg sign negative. Coordination normal. Finger-Nose-Finger Test normal.     Gait: Gait normal.     Deep Tendon Reflexes: Reflexes are normal and symmetric.     Comments: Slight sensation discrepancy along the right upper lip, right side of the forehead.  Sensation significantly improved after Valium.  Psychiatric:        Behavior: Behavior normal.        Thought Content: Thought  content normal.      ED Treatments / Results  Labs (all labs ordered are listed, but only abnormal results are displayed) Labs Reviewed  CBC - Abnormal; Notable for the following components:      Result Value   RBC 5.32 (*)    All other components within normal limits  BASIC METABOLIC PANEL - Abnormal; Notable for the following components:   Glucose, Bld 100 (*)    All other components within normal limits    EKG None  Radiology Ct Head Wo Contrast  Result Date: 02/03/2019 CLINICAL DATA:  Right facial and right-sided upper and lower extremity numbness EXAM: CT HEAD WITHOUT CONTRAST TECHNIQUE: Contiguous axial images were obtained from the base of the skull through the vertex without intravenous contrast. COMPARISON:  None. FINDINGS: Brain: The ventricles are normal in size and configuration. There is no intracranial mass, hemorrhage, extra-axial fluid collection, or midline shift. Brain parenchyma appears unremarkable. No evident acute infarct. Vascular: No hyperdense vessel. There is no appreciable vascular calcification. Skull: The bony calvarium appears intact. Sinuses/Orbits: Visualized paranasal sinuses are clear. Visualized orbits appear symmetric bilaterally. Other: Mastoid air cells are clear. IMPRESSION: Study within normal limits. Electronically Signed   By: Lowella Grip III M.D.   On: 02/03/2019 13:09    Procedures Procedures (including critical care time)  Medications Ordered in ED Medications  diazepam (VALIUM) tablet 5 mg (5 mg Oral Given 02/03/19 1328)     Initial Impression / Assessment and Plan / ED Course  I have reviewed the triage vital signs and the nursing notes.  Pertinent labs & imaging results that were available during my care of the  patient were reviewed by me and considered in my medical decision making (see chart for details).        33 year old female with right-sided facial numbness, anxiety as well as reports of right leg and right arm  numbness.  Patient noted to be hypertensive, has been off her blood pressure medicines for 2 months but recently restarted her medications daily 2 days ago.  She denies any headaches vision changes.  No neurological deficits.  CT of the head negative.  She had normal CBC and BMP.  Patient was given 5 mg of Valium which alleviated her anxiousness and did help with resolution of numbness and tingling in her arm, leg as well as the right side of her face.  Patient stable and ready for discharge.  She is encouraged to follow-up PCP.  She understands signs symptoms return to ED for.  Final Clinical Impressions(s) / ED Diagnoses   Final diagnoses:  Hypertension, unspecified type  Right leg paresthesias  Paresthesia of right arm  Facial paresthesia  Anxiety    ED Discharge Orders    None       Renata Caprice 02/03/19 1552    Harvest Dark, MD 02/04/19 [phone]

## 2019-02-03 NOTE — ED Provider Notes (Signed)
-----------------------------------------   1:57 PM on 02/03/2019 -----------------------------------------  Patient seen in conjunction with physician assistant.  Overall the patient appears extremely well, normal physical exam during my evaluation including a normal neurological exam.  Patient states she feels much better.  Blood pressure currently 157/110.  Patient restarted amlodipine 3 days ago.  Patient's blood pressure was significantly elevated earlier however has responded very well to anxiety type medication.  CT scan of the head is reassuring.  We will check basic labs.  Long the patient's lab work has resulted normal patient will follow-up with her primary care doctor.  I discussed this plan of care with the patient and she is agreeable and will follow-up with her doctor.   Harvest Dark, MD 02/03/19 1358

## 2019-02-03 NOTE — ED Notes (Signed)
Patient transported to CT 

## 2019-02-09 ENCOUNTER — Other Ambulatory Visit: Payer: Self-pay

## 2019-02-09 ENCOUNTER — Telehealth (INDEPENDENT_AMBULATORY_CARE_PROVIDER_SITE_OTHER): Payer: BLUE CROSS/BLUE SHIELD | Admitting: Emergency Medicine

## 2019-02-09 ENCOUNTER — Encounter: Payer: Self-pay | Admitting: Emergency Medicine

## 2019-02-09 DIAGNOSIS — I10 Essential (primary) hypertension: Secondary | ICD-10-CM

## 2019-02-09 MED ORDER — LISINOPRIL 10 MG PO TABS: 10.0000 mg | ORAL_TABLET | Freq: Every day | ORAL | 3 refills | Status: DC

## 2019-02-09 NOTE — Progress Notes (Signed)
Telemedicine Encounter- SOAP NOTE Established Patient  This videotelephone encounter was conducted with the patient's (or proxy's) verbal consent via audio telecommunications: yes/no: Yes Patient was instructed to have this encounter in a suitably private space; and to only have persons present to whom they give permission to participate. In addition, patient identity was confirmed by use of name plus two identifiers (DOB and address).  I discussed the limitations, risks, security and privacy concerns of performing an evaluation and management service by telephone and the availability of in person appointments. I also discussed with the patient that there may be a patient responsible charge related to this service. The patient expressed understanding and agreed to proceed.  I spent a total of TIME; 0 MIN TO 60 MIN: 15 minutes talking with the patient or their proxy.  No chief complaint on file. Blood pressure follow-up  Subjective   Alexis Donaldson is a 33 y.o. female established patient. Telephone visit today for hypertension follow-up.  Had hypertensive urgency recently and was seen in the ED on 02/03/2019.  Presently taking amlodipine 5 mg daily.  Today she feels well and has no complaints.  Blood pressures at home: 135-146/89-99.  BP Readings from Last 3 Encounters:  02/03/19 (!) 163/117  01/31/19 (!) 146/90  12/16/18 130/84   ED visit 02/03/2019 with the following summary: 33 year old female with right-sided facial numbness, anxiety as well as reports of right leg and right arm numbness.  Patient noted to be hypertensive, has been off her blood pressure medicines for 2 months but recently restarted her medications daily 2 days ago.  She denies any headaches vision changes.  No neurological deficits.  CT of the head negative.  She had normal CBC and BMP.  Patient was given 5 mg of Valium which alleviated her anxiousness and did help with resolution of numbness and tingling in her arm, leg  as well as the right side of her face.  Patient stable and ready for discharge.  She is encouraged to follow-up PCP.  She understands signs symptoms return to ED for. 1:57 PM on 02/03/2019 -----------------------------------------  Patient seen in conjunction with physician assistant.  Overall the patient appears extremely well, normal physical exam during my evaluation including a normal neurological exam.  Patient states she feels much better.  Blood pressure currently 157/110.  Patient restarted amlodipine 3 days ago.  Patient's blood pressure was significantly elevated earlier however has responded very well to anxiety type medication.  CT scan of the head is reassuring.  We will check basic labs.  Long the patient's lab work has resulted normal patient will follow-up with her primary care doctor.  I discussed this plan of care with the patient and she is agreeable and will follow-up with her doctor.   Harvest Dark, MD 02/03/19 1358 HPI   Patient Active Problem List   Diagnosis Date Noted   Seasonal allergies 01/31/2019   Insomnia 01/31/2019   Allergic rhinitis 01/03/2019   Chronic bilateral low back pain without sciatica 01/03/2019   Chronic anxiety 01/03/2019   Essential hypertension 01/03/2019   Mild persistent asthma 01/19/2017    Past Medical History:  Diagnosis Date   Allergy    Anxiety    Asthma    Cervical dysplasia    Depression    HSV-1 (herpes simplex virus 1) infection    HSV-2 (herpes simplex virus 2) infection    Hypertension    STD (sexually transmitted disease)    Chlamydia X2,HSV    Current Outpatient  Medications  Medication Sig Dispense Refill   albuterol (PROVENTIL HFA;VENTOLIN HFA) 108 (90 Base) MCG/ACT inhaler Inhale 2 puffs into the lungs every 4 (four) hours as needed for wheezing or shortness of breath (cough, shortness of breath or wheezing.). 1 Inhaler 1   amLODipine (NORVASC) 5 MG tablet Take 1 tablet (5 mg total) by mouth  daily. 90 tablet 3   beclomethasone (QVAR) 40 MCG/ACT inhaler Inhale 1 puff into the lungs 2 (two) times daily. 1 Inhaler 1   Blood Pressure Monitoring (BLOOD PRESSURE KIT) DEVI 1 Units by Does not apply route daily. 1 Device 0   cetirizine (ZYRTEC) 10 MG tablet Take 1 tablet (10 mg total) by mouth daily. 90 tablet 1   cyclobenzaprine (FLEXERIL) 5 MG tablet Take 1 tablet (5 mg total) by mouth daily as needed for muscle spasms. 30 tablet 0   fluticasone (FLONASE) 50 MCG/ACT nasal spray Place 2 sprays into both nostrils daily. 16 g 3   hydrOXYzine (ATARAX/VISTARIL) 25 MG tablet Take 1-2 tablets (25-50 mg total) by mouth 3 (three) times daily as needed. 90 tablet 1   ibuprofen (ADVIL,MOTRIN) 800 MG tablet Take 1 tablet (800 mg total) by mouth every 6 (six) hours as needed (as needed for pain). 30 tablet 0   levonorgestrel (MIRENA) 20 MCG/24HR IUD by Intrauterine route.     montelukast (SINGULAIR) 10 MG tablet Take 1 tablet (10 mg total) by mouth at bedtime. 30 tablet 3   No current facility-administered medications for this visit.     Allergies  Allergen Reactions   Effexor [Venlafaxine] Anxiety   Zoloft [Sertraline Hcl] Anxiety and Other (See Comments)    Social History   Socioeconomic History   Marital status: Single    Spouse name: Not on file   Number of children: 0   Years of education: Not on file   Highest education level: Not on file  Occupational History   Not on file  Social Needs   Financial resource strain: Not on file   Food insecurity:    Worry: Not on file    Inability: Not on file   Transportation needs:    Medical: Not on file    Non-medical: Not on file  Tobacco Use   Smoking status: Former Smoker    Packs/day: 0.25    Years: 4.00    Pack years: 1.00    Types: Cigarettes   Smokeless tobacco: Never Used  Substance and Sexual Activity   Alcohol use: Yes    Alcohol/week: 8.0 standard drinks    Types: 4 Glasses of wine, 1 Cans of beer,  3 Shots of liquor per week   Drug use: Yes    Types: Marijuana    Comment: Occas   Sexual activity: Yes    Birth control/protection: I.U.D.    Comment: Mirena inserted 04/07/2018-1st intercourse 33 yo-More than 5 partners  Lifestyle   Physical activity:    Days per week: Not on file    Minutes per session: Not on file   Stress: Not on file  Relationships   Social connections:    Talks on phone: Not on file    Gets together: Not on file    Attends religious service: Not on file    Active member of club or organization: Not on file    Attends meetings of clubs or organizations: Not on file    Relationship status: Not on file   Intimate partner violence:    Fear of current or ex partner: Not  on file    Emotionally abused: Not on file    Physically abused: Not on file    Forced sexual activity: Not on file  Other Topics Concern   Not on file  Social History Narrative   Program supervisor    Review of Systems  Constitutional: Negative.  Negative for chills and fever.  HENT: Negative for congestion, hearing loss, nosebleeds and sore throat.   Eyes: Negative.  Negative for blurred vision and double vision.  Respiratory: Negative.  Negative for cough, hemoptysis, sputum production and shortness of breath.   Cardiovascular: Negative.  Negative for chest pain, palpitations and leg swelling.  Gastrointestinal: Negative.  Negative for abdominal pain, blood in stool, diarrhea, nausea and vomiting.  Genitourinary: Negative for dysuria and hematuria.  Musculoskeletal: Negative for myalgias.  Skin: Negative.  Negative for rash.  Neurological: Negative for dizziness, sensory change, speech change, focal weakness, seizures, loss of consciousness and headaches.  All other systems reviewed and are negative.   Objective   Vitals as reported by the patient: 670-110/03-49. weight: 198 pounds. There were no vitals filed for this visit. Physical Exam Constitutional:      Appearance:  Normal appearance.  HENT:     Head: Normocephalic.  Eyes:     Extraocular Movements: Extraocular movements intact.  Neck:     Musculoskeletal: Normal range of motion.  Pulmonary:     Effort: Pulmonary effort is normal.  Neurological:     Mental Status: She is alert and oriented to person, place, and time.  Psychiatric:        Mood and Affect: Mood normal.    There are no diagnoses linked to this encounter.  Pertinent labs & imaging results that were available during my care of the patient were reviewed by me and considered in my medical decision making (see chart for details). Diagnoses and all orders for this visit:  Uncontrolled hypertension -     lisinopril (ZESTRIL) 10 MG tablet; Take 1 tablet (10 mg total) by mouth daily.  Blood pressure still uncontrolled.  Continue amlodipine 5 mg daily and start lisinopril 10 mg daily.  Cardiovascular risk associated with uncontrolled hypertension discussed with patient.  Recommended DASH diet.  We talked about nutrition and regular physical activity. Recommend to continue monitoring blood pressure at home twice a day. Follow-up in the office in 4 weeks.  I discussed the assessment and treatment plan with the patient. The patient was provided an opportunity to ask questions and all were answered. The patient agreed with the plan and demonstrated an understanding of the instructions.   The patient was advised to call back or seek an in-person evaluation if the symptoms worsen or if the condition fails to improve as anticipated.  I provided 15 minutes of non-face-to-face time during this encounter.  Horald Pollen, MD  [facility]

## 2019-02-09 NOTE — Progress Notes (Signed)
Contacted patient to triage for appointment. Patient is following up on elevated blood pressure.Patient states on 02/02/2019 her upper lip was swollen, face numbness, and right leg tingling. She went to [facility] on 02/03/2019 and her blood pressure was elevated at 190/126. Patient states after she was seen at the Urgent Care she was sent to [facility] emergency room. Patient states on 01/09/2019 her blood pressure was 141/91 pulse 92 and this morning it was 149/95 pulse 76.

## 2019-02-12 ENCOUNTER — Encounter: Payer: Self-pay | Admitting: Emergency Medicine

## 2019-02-14 NOTE — Telephone Encounter (Signed)
Both are low-dose so I recommend she takes them both in the morning.  Thanks.

## 2019-03-04 ENCOUNTER — Other Ambulatory Visit: Payer: Self-pay

## 2019-03-04 ENCOUNTER — Encounter: Payer: Self-pay | Admitting: Emergency Medicine

## 2019-03-07 ENCOUNTER — Other Ambulatory Visit: Payer: Self-pay

## 2019-03-07 ENCOUNTER — Encounter: Payer: Self-pay | Admitting: Gynecology

## 2019-03-07 ENCOUNTER — Ambulatory Visit: Payer: BLUE CROSS/BLUE SHIELD | Admitting: Gynecology

## 2019-03-07 ENCOUNTER — Other Ambulatory Visit: Payer: Self-pay | Admitting: Emergency Medicine

## 2019-03-07 VITALS — BP 122/78 | Ht 62.0 in | Wt 206.0 lb

## 2019-03-07 DIAGNOSIS — G8929 Other chronic pain: Secondary | ICD-10-CM

## 2019-03-07 DIAGNOSIS — M545 Low back pain, unspecified: Secondary | ICD-10-CM

## 2019-03-07 DIAGNOSIS — Z01419 Encounter for gynecological examination (general) (routine) without abnormal findings: Secondary | ICD-10-CM | POA: Diagnosis not present

## 2019-03-07 DIAGNOSIS — D251 Intramural leiomyoma of uterus: Secondary | ICD-10-CM | POA: Diagnosis not present

## 2019-03-07 DIAGNOSIS — Z30431 Encounter for routine checking of intrauterine contraceptive device: Secondary | ICD-10-CM

## 2019-03-07 MED ORDER — CYCLOBENZAPRINE HCL 5 MG PO TABS: 5.0000 mg | ORAL_TABLET | Freq: Every day | ORAL | 0 refills | Status: DC | PRN

## 2019-03-07 NOTE — Telephone Encounter (Signed)
Hello Dr. Mitchel Honour,   Ms. Brannick is requesting a refill of her Flexeril which she takes for chronic back pain.  She was last prescribed this at her 01/03/2019 visit and was given #30.  No follow up was noted in the OV note.    It should be pended for you.    Thank you,  Wilfred Curtis

## 2019-03-07 NOTE — Telephone Encounter (Signed)
OK. Thanks.

## 2019-03-07 NOTE — Progress Notes (Signed)
    Yarelin Reichardt 09-07-1986 [id number]        33 y.o.  G0P0000 for annual gynecologic exam.  Doing well without complaints  Past medical history,surgical history, problem list, medications, allergies, family history and social history were all reviewed and documented as reviewed in the EPIC chart.  ROS:  Performed with pertinent positives and negatives included in the history, assessment and plan.   Additional significant findings : None   Exam: [name] assistant Vitals:   03/07/19 0808  BP: 122/78  Weight: 206 lb (93.4 kg)  Height: 5\' 2"  (1.575 m)   Body mass index is 37.68 kg/m.  General appearance:  Normal affect, orientation and appearance. Skin: Grossly normal HEENT: Without gross lesions.  No cervical or supraclavicular adenopathy. Thyroid normal.  Lungs:  Clear without wheezing, rales or rhonchi Cardiac: RR, without RMG Abdominal:  Soft, nontender, without masses, guarding, rebound, organomegaly or hernia Breasts:  Examined lying and sitting without masses, retractions, discharge or axillary adenopathy. Pelvic:  Ext, BUS, Vagina: Normal  Cervix: Normal.  IUD string not visualized  Uterus: Anteverted, normal size, shape and contour, midline and mobile nontender   Adnexa: Without masses or tenderness    Anus and perineum: Normal   Rectovaginal: Normal sphincter tone without palpated masses or tenderness.    Assessment/Plan:  33 y.o. G0P0000 female for annual gynecologic exam.  Without menses, Mirena IUD  1. Mirena IUD 04/2018.  Doing well without menses.  Had ultrasound 12/2018 which verified IUD in normal position as we did not see the string. 2. STD screening discussed and declined at this time.  Had full blood and cervix screening in November. 3. Breast health.  SBE monthly reviewed.  Breast exam normal today. 4. Pap smear/HPV 2019.  No Pap smear done today.  History of cryosurgery x2 a number of years ago.  No recent abnormal Pap smears.  Plan follow-up Pap  smear/HPV at 5-year interval per current screening guidelines. 5. Health maintenance.  Patient reports routine lab work done elsewhere.  Follow-up 1 year, sooner as needed.  Anastasio Auerbach MD, 8:35 AM 03/07/2019

## 2019-03-07 NOTE — Patient Instructions (Signed)
Follow-up in 1 year for annual exam, sooner if any issues. 

## 2019-04-04 ENCOUNTER — Encounter: Payer: Self-pay | Admitting: Emergency Medicine

## 2019-04-04 ENCOUNTER — Ambulatory Visit (INDEPENDENT_AMBULATORY_CARE_PROVIDER_SITE_OTHER): Payer: BC Managed Care – PPO | Admitting: Emergency Medicine

## 2019-04-04 ENCOUNTER — Other Ambulatory Visit (HOSPITAL_COMMUNITY)
Admission: RE | Admit: 2019-04-04 | Discharge: 2019-04-04 | Disposition: A | Discharge status: Home / Self Care | Payer: BC Managed Care – PPO | Source: Ambulatory Visit | Attending: Emergency Medicine | Admitting: Emergency Medicine

## 2019-04-04 ENCOUNTER — Other Ambulatory Visit: Payer: Self-pay

## 2019-04-04 VITALS — BP 123/80 | HR 82 | Temp 98.5°F | Resp 16 | Ht 62.75 in | Wt 209.8 lb

## 2019-04-04 DIAGNOSIS — I10 Essential (primary) hypertension: Secondary | ICD-10-CM

## 2019-04-04 DIAGNOSIS — Z711 Person with feared health complaint in whom no diagnosis is made: Secondary | ICD-10-CM

## 2019-04-04 NOTE — Progress Notes (Signed)
Alexis Donaldson 33 y.o.   Chief Complaint  Patient presents with  . Hypertension    follow up, per patient wants STD testing   BP Readings from Last 3 Encounters:  04/04/19 123/80  03/07/19 122/78  02/03/19 (!) 163/117    HISTORY OF PRESENT ILLNESS: This is a 33 y.o. female with history of hypertension here for follow-up.  Taking amlodipine 5 mg daily.  Doing well.  Blood readings at home within normal limits. Also wants an STD testing.  Asymptomatic.  Sexually active using condoms for protection.  She likes to get checked every 6 months. No other complaints or medical concerns today.  HPI   Prior to Admission medications   Medication Sig Start Date End Date Taking? Authorizing Provider  albuterol (PROVENTIL HFA;VENTOLIN HFA) 108 (90 Base) MCG/ACT inhaler Inhale 2 puffs into the lungs every 4 (four) hours as needed for wheezing or shortness of breath (cough, shortness of breath or wheezing.). 01/14/17  Yes Timmothy Euler, [place] D, PA-C  amLODipine (NORVASC) 5 MG tablet Take 1 tablet (5 mg total) by mouth daily. 01/31/19  Yes Tarell Schollmeyer, Ines Bloomer, MD  beclomethasone (QVAR) 40 MCG/ACT inhaler Inhale 1 puff into the lungs 2 (two) times daily. 01/03/19  Yes Katerine Morua, Ines Bloomer, MD  cetirizine (ZYRTEC) 10 MG tablet Take 1 tablet (10 mg total) by mouth daily. 01/03/19  Yes Zamara Cozad, Ines Bloomer, MD  cyclobenzaprine (FLEXERIL) 5 MG tablet Take 1 tablet (5 mg total) by mouth daily as needed for muscle spasms. 03/07/19  Yes Labrea Eccleston, Ines Bloomer, MD  fluticasone [facility]) 50 MCG/ACT nasal spray Place 2 sprays into both nostrils daily. 01/03/19  Yes Ahlani Wickes, Ines Bloomer, MD  hydrOXYzine (ATARAX/VISTARIL) 25 MG tablet Take 1-2 tablets (25-50 mg total) by mouth 3 (three) times daily as needed. 01/03/19  Yes Kenlei Safi, Ines Bloomer, MD  ibuprofen (ADVIL,MOTRIN) 800 MG tablet Take 1 tablet (800 mg total) by mouth every 6 (six) hours as needed (as needed for pain). 01/04/19  Yes Aarib Pulido, Ines Bloomer, MD   levonorgestrel [facility]) 20 MCG/24HR IUD by Intrauterine route.   Yes [provider]  lisinopril (ZESTRIL) 10 MG tablet Take 1 tablet (10 mg total) by mouth daily. 02/09/19  Yes Gennie Dib, Ines Bloomer, MD  montelukast (SINGULAIR) 10 MG tablet Take 1 tablet (10 mg total) by mouth at bedtime. 01/31/19  Yes Shaana Acocella, Ines Bloomer, MD  Blood Pressure Monitoring (BLOOD PRESSURE KIT) DEVI 1 Units by Does not apply route daily. 12/12/16   Tenna Delaine D, PA-C    Allergies  Allergen Reactions  . Effexor [Venlafaxine] Anxiety  . Zoloft [Sertraline Hcl] Anxiety and Other (See Comments)    Patient Active Problem List   Diagnosis Date Noted  . Seasonal allergies 01/31/2019  . Insomnia 01/31/2019  . Chronic bilateral low back pain without sciatica 01/03/2019  . Chronic anxiety 01/03/2019  . Essential hypertension 01/03/2019  . Mild persistent asthma 01/19/2017    Past Medical History:  Diagnosis Date  . Allergy   . Anxiety   . Asthma   . Cervical dysplasia   . Depression   . HSV-1 (herpes simplex virus 1) infection   . HSV-2 (herpes simplex virus 2) infection   . Hypertension   . STD (sexually transmitted disease)    Chlamydia X2,HSV    Past Surgical History:  Procedure Laterality Date  . CERVICAL BIOPSY    . COLPOSCOPY    . GYNECOLOGIC CRYOSURGERY    . Mirena     Inserted 04/07/2018  . WISDOM TOOTH EXTRACTION  Social History   Socioeconomic History  . Marital status: Single    Spouse name: Not on file  . Number of children: 0  . Years of education: Not on file  . Highest education level: Not on file  Occupational History  . Not on file  Social Needs  . Financial resource strain: Not on file  . Food insecurity    Worry: Not on file    Inability: Not on file  . Transportation needs    Medical: Not on file    Non-medical: Not on file  Tobacco Use  . Smoking status: Former Smoker    Packs/day: 0.25    Years: 4.00    Pack years: 1.00    Types: Cigarettes   . Smokeless tobacco: Never Used  Substance and Sexual Activity  . Alcohol use: Yes    Alcohol/week: 8.0 standard drinks    Types: 4 Glasses of wine, 1 Cans of beer, 3 Shots of liquor per week  . Drug use: Yes    Types: Marijuana    Comment: Occas  . Sexual activity: Yes    Birth control/protection: I.U.D.    Comment: Mirena inserted 04/07/2018-1st intercourse 33 yo-More than 5 partners  Lifestyle  . Physical activity    Days per week: Not on file    Minutes per session: Not on file  . Stress: Not on file  Relationships  . Social Herbalist on phone: Not on file    Gets together: Not on file    Attends religious service: Not on file    Active member of club or organization: Not on file    Attends meetings of clubs or organizations: Not on file    Relationship status: Not on file  . Intimate partner violence    Fear of current or ex partner: Not on file    Emotionally abused: Not on file    Physically abused: Not on file    Forced sexual activity: Not on file  Other Topics Concern  . Not on file  Social History Narrative   Program supervisor    Family History  Problem Relation Age of Onset  . Hyperlipidemia Mother   . Hypertension Mother   . Hypertension Father   . Heart attack Father   . Mental illness Sister   . Diabetes Maternal Grandmother   . Cancer Maternal Grandmother        Lung  . Cancer Maternal Grandfather        Lung  . Hypertension Maternal Grandfather   . Heart disease Maternal Grandfather   . Mental illness Paternal Grandmother   . Hypertension Paternal Grandfather   . Stroke Paternal Grandfather   . Mental illness Sister      Review of Systems  Constitutional: Negative.  Negative for chills and fever.  HENT: Negative.  Negative for congestion and sore throat.   Eyes: Negative.   Respiratory: Negative.  Negative for cough and shortness of breath.   Cardiovascular: Negative.  Negative for chest pain and palpitations.  Gastrointestinal:  Negative.  Negative for abdominal pain, diarrhea, nausea and vomiting.  Genitourinary: Negative.  Negative for dysuria, frequency, hematuria and urgency.  Musculoskeletal: Negative.  Negative for back pain, myalgias and neck pain.  Skin: Negative.   Neurological: Negative.  Negative for dizziness and headaches.  Endo/Heme/Allergies: Negative.   All other systems reviewed and are negative.  Vitals:   04/04/19 1012  BP: 123/80  Pulse: 82  Resp: 16  Temp: 98.5  F (36.9 C)  SpO2: 100%     Physical Exam Vitals signs reviewed.  Constitutional:      Appearance: Normal appearance.  HENT:     Head: Normocephalic and atraumatic.     Nose: Nose normal.     Mouth/Throat:     Mouth: Mucous membranes are moist.     Pharynx: Oropharynx is clear.  Eyes:     Extraocular Movements: Extraocular movements intact.     Conjunctiva/sclera: Conjunctivae normal.     Pupils: Pupils are equal, round, and reactive to light.  Neck:     Musculoskeletal: Normal range of motion and neck supple.  Cardiovascular:     Rate and Rhythm: Normal rate and regular rhythm.     Pulses: Normal pulses.     Heart sounds: Normal heart sounds.  Pulmonary:     Effort: Pulmonary effort is normal.     Breath sounds: Normal breath sounds.  Musculoskeletal: Normal range of motion.     Right lower leg: No edema.     Left lower leg: No edema.  Skin:    General: Skin is warm.     Capillary Refill: Capillary refill takes less than 2 seconds.  Neurological:     General: No focal deficit present.     Mental Status: She is alert and oriented to person, place, and time.  Psychiatric:        Mood and Affect: Mood normal.        Behavior: Behavior normal.      ASSESSMENT & PLAN: Alexis Donaldson was seen today for hypertension.  Diagnoses and all orders for this visit:  Essential hypertension  Concern about STD in female without diagnosis -     STD Panel -     GC/Chlamydia probe amp ([place])not at [facility]    Patient  Instructions       If you have lab work done today you will be contacted with your lab results within the next 2 weeks.  If you have not heard from [place] then please contact us. The fastest way to get your results is to register for My Chart.   IF you received an x-ray today, you will receive an invoice from [facility]. Please contact [facility] at [phone] with questions or concerns regarding your invoice.   IF you received labwork today, you will receive an invoice from [facility]. Please contact [facility] at [phone] with questions or concerns regarding your invoice.   Our billing staff will not be able to assist you with questions regarding bills from these companies.  You will be contacted with the lab results as soon as they are available. The fastest way to get your results is to activate your My Chart account. Instructions are located on the last page of this paperwork. If you have not heard from [place] regarding the results in 2 weeks, please contact this office.     Hypertension, Adult High blood pressure (hypertension) is when the force of blood pumping through the arteries is too strong. The arteries are the blood vessels that carry blood from the heart throughout the body. Hypertension forces the heart to work harder to pump blood and may cause arteries to become narrow or stiff. Untreated or uncontrolled hypertension can cause a heart attack, heart failure, a stroke, kidney disease, and other problems. A blood pressure reading consists of a higher number over a lower number. Ideally, your blood pressure should be below 120/80. The first ("top") number is called the systolic pressure.  It is a measure of the pressure in your arteries as your heart beats. The second ("bottom") number is called the diastolic pressure. It is a measure of the pressure in your arteries as the heart relaxes. What are the causes? The exact cause of this condition is not known. There are  some conditions that result in or are related to high blood pressure. What increases the risk? Some risk factors for high blood pressure are under your control. The following factors may make you more likely to develop this condition:  Smoking.  Having type 2 diabetes mellitus, high cholesterol, or both.  Not getting enough exercise or physical activity.  Being overweight.  Having too much fat, sugar, calories, or salt (sodium) in your diet.  Drinking too much alcohol. Some risk factors for high blood pressure may be difficult or impossible to change. Some of these factors include:  Having chronic kidney disease.  Having a family history of high blood pressure.  Age. Risk increases with age.  Race. You may be at higher risk if you are African American.  Gender. Men are at higher risk than women before age 62. After age 36, women are at higher risk than men.  Having obstructive sleep apnea.  Stress. What are the signs or symptoms? High blood pressure may not cause symptoms. Very high blood pressure (hypertensive crisis) may cause:  Headache.  Anxiety.  Shortness of breath.  Nosebleed.  Nausea and vomiting.  Vision changes.  Severe chest pain.  Seizures. How is this diagnosed? This condition is diagnosed by measuring your blood pressure while you are seated, with your arm resting on a flat surface, your legs uncrossed, and your feet flat on the floor. The cuff of the blood pressure monitor will be placed directly against the skin of your upper arm at the level of your heart. It should be measured at least twice using the same arm. Certain conditions can cause a difference in blood pressure between your right and left arms. Certain factors can cause blood pressure readings to be lower or higher than normal for a short period of time:  When your blood pressure is higher when you are in a health care provider's office than when you are at home, this is called white coat  hypertension. Most people with this condition do not need medicines.  When your blood pressure is higher at home than when you are in a health care provider's office, this is called masked hypertension. Most people with this condition may need medicines to control blood pressure. If you have a high blood pressure reading during one visit or you have normal blood pressure with other risk factors, you may be asked to:  Return on a different day to have your blood pressure checked again.  Monitor your blood pressure at home for 1 week or longer. If you are diagnosed with hypertension, you may have other blood or imaging tests to help your health care provider understand your overall risk for other conditions. How is this treated? This condition is treated by making healthy lifestyle changes, such as eating healthy foods, exercising more, and reducing your alcohol intake. Your health care provider may prescribe medicine if lifestyle changes are not enough to get your blood pressure under control, and if:  Your systolic blood pressure is above 130.  Your diastolic blood pressure is above 80. Your personal target blood pressure may vary depending on your medical conditions, your age, and other factors. Follow these instructions at home:  Eating and drinking   Eat a diet that is high in fiber and potassium, and low in sodium, added sugar, and fat. An example eating plan is called the DASH (Dietary Approaches to Stop Hypertension) diet. To eat this way: ? Eat plenty of fresh fruits and vegetables. Try to fill one half of your plate at each meal with fruits and vegetables. ? Eat whole grains, such as whole-wheat pasta, brown rice, or whole-grain bread. Fill about one fourth of your plate with whole grains. ? Eat or drink low-fat dairy products, such as skim milk or low-fat yogurt. ? Avoid fatty cuts of meat, processed or cured meats, and poultry with skin. Fill about one fourth of your plate with lean  proteins, such as fish, chicken without skin, beans, eggs, or tofu. ? Avoid pre-made and processed foods. These tend to be higher in sodium, added sugar, and fat.  Reduce your daily sodium intake. Most people with hypertension should eat less than 1,500 mg of sodium a day.  Do not drink alcohol if: ? Your health care provider tells you not to drink. ? You are pregnant, may be pregnant, or are planning to become pregnant.  If you drink alcohol: ? Limit how much you use to:  0-1 drink a day for women.  0-2 drinks a day for men. ? Be aware of how much alcohol is in your drink. In the [place] one drink equals one 12 oz bottle of beer (355 mL), one 5 oz glass of wine (148 mL), or one 1 oz glass of hard liquor (44 mL). Lifestyle   Work with your health care provider to maintain a healthy body weight or to lose weight. Ask what an ideal weight is for you.  Get at least 30 minutes of exercise most days of the week. Activities may include walking, swimming, or biking.  Include exercise to strengthen your muscles (resistance exercise), such as Pilates or lifting weights, as part of your weekly exercise routine. Try to do these types of exercises for 30 minutes at least 3 days a week.  Do not use any products that contain nicotine or tobacco, such as cigarettes, e-cigarettes, and chewing tobacco. If you need help quitting, ask your health care provider.  Monitor your blood pressure at home as told by your health care provider.  Keep all follow-up visits as told by your health care provider. This is important. Medicines  Take over-the-counter and prescription medicines only as told by your health care provider. Follow directions carefully. Blood pressure medicines must be taken as prescribed.  Do not skip doses of blood pressure medicine. Doing this puts you at risk for problems and can make the medicine less effective.  Ask your health care provider about side effects or reactions to  medicines that you should watch for. Contact a health care provider if you:  Think you are having a reaction to a medicine you are taking.  Have headaches that keep coming back (recurring).  Feel dizzy.  Have swelling in your ankles.  Have trouble with your vision. Get help right away if you:  Develop a severe headache or confusion.  Have unusual weakness or numbness.  Feel faint.  Have severe pain in your chest or abdomen.  Vomit repeatedly.  Have trouble breathing. Summary  Hypertension is when the force of blood pumping through your arteries is too strong. If this condition is not controlled, it may put you at risk for serious complications.  Your personal target blood pressure  may vary depending on your medical conditions, your age, and other factors. For most people, a normal blood pressure is less than 120/80.  Hypertension is treated with lifestyle changes, medicines, or a combination of both. Lifestyle changes include losing weight, eating a healthy, low-sodium diet, exercising more, and limiting alcohol. This information is not intended to replace advice given to you by your health care provider. Make sure you discuss any questions you have with your health care provider. Document Released: 09/22/2005 Document Revised: 06/02/2018 Document Reviewed: 06/02/2018 Elsevier Patient Education  2020 [employer]       Agustina Caroli, MD [facility]

## 2019-04-04 NOTE — Patient Instructions (Addendum)
   If you have lab work done today you will be contacted with your lab results within the next 2 weeks.  If you have not heard from us then please contact us. The fastest way to get your results is to register for My Chart.   IF you received an x-ray today, you will receive an invoice from [facility]. Please contact [facility] at [phone] with questions or concerns regarding your invoice.   IF you received labwork today, you will receive an invoice from [facility]. Please contact [facility] at [phone] with questions or concerns regarding your invoice.   Our billing staff will not be able to assist you with questions regarding bills from these companies.  You will be contacted with the lab results as soon as they are available. The fastest way to get your results is to activate your My Chart account. Instructions are located on the last page of this paperwork. If you have not heard from us regarding the results in 2 weeks, please contact this office.     Hypertension, Adult High blood pressure (hypertension) is when the force of blood pumping through the arteries is too strong. The arteries are the blood vessels that carry blood from the heart throughout the body. Hypertension forces the heart to work harder to pump blood and may cause arteries to become narrow or stiff. Untreated or uncontrolled hypertension can cause a heart attack, heart failure, a stroke, kidney disease, and other problems. A blood pressure reading consists of a higher number over a lower number. Ideally, your blood pressure should be below 120/80. The first ("top") number is called the systolic pressure. It is a measure of the pressure in your arteries as your heart beats. The second ("bottom") number is called the diastolic pressure. It is a measure of the pressure in your arteries as the heart relaxes. What are the causes? The exact cause of this condition is not known. There are some conditions  that result in or are related to high blood pressure. What increases the risk? Some risk factors for high blood pressure are under your control. The following factors may make you more likely to develop this condition:  Smoking.  Having type 2 diabetes mellitus, high cholesterol, or both.  Not getting enough exercise or physical activity.  Being overweight.  Having too much fat, sugar, calories, or salt (sodium) in your diet.  Drinking too much alcohol. Some risk factors for high blood pressure may be difficult or impossible to change. Some of these factors include:  Having chronic kidney disease.  Having a family history of high blood pressure.  Age. Risk increases with age.  Race. You may be at higher risk if you are African American.  Gender. Men are at higher risk than women before age 45. After age 65, women are at higher risk than men.  Having obstructive sleep apnea.  Stress. What are the signs or symptoms? High blood pressure may not cause symptoms. Very high blood pressure (hypertensive crisis) may cause:  Headache.  Anxiety.  Shortness of breath.  Nosebleed.  Nausea and vomiting.  Vision changes.  Severe chest pain.  Seizures. How is this diagnosed? This condition is diagnosed by measuring your blood pressure while you are seated, with your arm resting on a flat surface, your legs uncrossed, and your feet flat on the floor. The cuff of the blood pressure monitor will be placed directly against the skin of your upper arm at the level of your heart.   It should be measured at least twice using the same arm. Certain conditions can cause a difference in blood pressure between your right and left arms. Certain factors can cause blood pressure readings to be lower or higher than normal for a short period of time:  When your blood pressure is higher when you are in a health care provider's office than when you are at home, this is called white coat hypertension.  Most people with this condition do not need medicines.  When your blood pressure is higher at home than when you are in a health care provider's office, this is called masked hypertension. Most people with this condition may need medicines to control blood pressure. If you have a high blood pressure reading during one visit or you have normal blood pressure with other risk factors, you may be asked to:  Return on a different day to have your blood pressure checked again.  Monitor your blood pressure at home for 1 week or longer. If you are diagnosed with hypertension, you may have other blood or imaging tests to help your health care provider understand your overall risk for other conditions. How is this treated? This condition is treated by making healthy lifestyle changes, such as eating healthy foods, exercising more, and reducing your alcohol intake. Your health care provider may prescribe medicine if lifestyle changes are not enough to get your blood pressure under control, and if:  Your systolic blood pressure is above 130.  Your diastolic blood pressure is above 80. Your personal target blood pressure may vary depending on your medical conditions, your age, and other factors. Follow these instructions at home: Eating and drinking   Eat a diet that is high in fiber and potassium, and low in sodium, added sugar, and fat. An example eating plan is called the DASH (Dietary Approaches to Stop Hypertension) diet. To eat this way: ? Eat plenty of fresh fruits and vegetables. Try to fill one half of your plate at each meal with fruits and vegetables. ? Eat whole grains, such as whole-wheat pasta, brown rice, or whole-grain bread. Fill about one fourth of your plate with whole grains. ? Eat or drink low-fat dairy products, such as skim milk or low-fat yogurt. ? Avoid fatty cuts of meat, processed or cured meats, and poultry with skin. Fill about one fourth of your plate with lean proteins, such  as fish, chicken without skin, beans, eggs, or tofu. ? Avoid pre-made and processed foods. These tend to be higher in sodium, added sugar, and fat.  Reduce your daily sodium intake. Most people with hypertension should eat less than 1,500 mg of sodium a day.  Do not drink alcohol if: ? Your health care provider tells you not to drink. ? You are pregnant, may be pregnant, or are planning to become pregnant.  If you drink alcohol: ? Limit how much you use to:  0-1 drink a day for women.  0-2 drinks a day for men. ? Be aware of how much alcohol is in your drink. In the [place] one drink equals one 12 oz bottle of beer (355 mL), one 5 oz glass of wine (148 mL), or one 1 oz glass of hard liquor (44 mL). Lifestyle   Work with your health care provider to maintain a healthy body weight or to lose weight. Ask what an ideal weight is for you.  Get at least 30 minutes of exercise most days of the week. Activities may include walking, swimming, or   biking.  Include exercise to strengthen your muscles (resistance exercise), such as Pilates or lifting weights, as part of your weekly exercise routine. Try to do these types of exercises for 30 minutes at least 3 days a week.  Do not use any products that contain nicotine or tobacco, such as cigarettes, e-cigarettes, and chewing tobacco. If you need help quitting, ask your health care provider.  Monitor your blood pressure at home as told by your health care provider.  Keep all follow-up visits as told by your health care provider. This is important. Medicines  Take over-the-counter and prescription medicines only as told by your health care provider. Follow directions carefully. Blood pressure medicines must be taken as prescribed.  Do not skip doses of blood pressure medicine. Doing this puts you at risk for problems and can make the medicine less effective.  Ask your health care provider about side effects or reactions to medicines that you  should watch for. Contact a health care provider if you:  Think you are having a reaction to a medicine you are taking.  Have headaches that keep coming back (recurring).  Feel dizzy.  Have swelling in your ankles.  Have trouble with your vision. Get help right away if you:  Develop a severe headache or confusion.  Have unusual weakness or numbness.  Feel faint.  Have severe pain in your chest or abdomen.  Vomit repeatedly.  Have trouble breathing. Summary  Hypertension is when the force of blood pumping through your arteries is too strong. If this condition is not controlled, it may put you at risk for serious complications.  Your personal target blood pressure may vary depending on your medical conditions, your age, and other factors. For most people, a normal blood pressure is less than 120/80.  Hypertension is treated with lifestyle changes, medicines, or a combination of both. Lifestyle changes include losing weight, eating a healthy, low-sodium diet, exercising more, and limiting alcohol. This information is not intended to replace advice given to you by your health care provider. Make sure you discuss any questions you have with your health care provider. Document Released: 09/22/2005 Document Revised: 06/02/2018 Document Reviewed: 06/02/2018 [employer]vier Patient Education  2020 [employer]  

## 2019-04-05 LAB — GC/CHLAMYDIA PROBE AMP (~~LOC~~) NOT AT ARMC
Chlamydia: NEGATIVE
Neisseria Gonorrhea: NEGATIVE

## 2019-04-06 LAB — RPR+HSVIGM+HBSAG+HSV2(IGG)+...
HIV Screen 4th Generation wRfx: NONREACTIVE
HSV 2 IgG, Type Spec: 6.73 index — ABNORMAL HIGH (ref 0.00–0.90)
HSVI/II Comb IgM: <0.91 Ratio (ref 0.00–0.90)
Hepatitis B Surface Ag: NEGATIVE
RPR Ser Ql: NONREACTIVE

## 2019-04-07 ENCOUNTER — Encounter: Payer: Self-pay | Admitting: Emergency Medicine

## 2019-05-03 ENCOUNTER — Ambulatory Visit: Payer: Self-pay

## 2019-05-03 NOTE — Telephone Encounter (Signed)
Incoming call from Patient with a complaint of using a certain seasoning .  Developed hives.   Sore throat.  Took a benadryl  Felt better.  This afternoon. Throat started swelling  Again denies SOB.  Reviewed protocol  Provided care advice.  Patient voiced understanding.     Reason for Disposition . [1] Sore throat is the only symptom AND [2] sore throat present < 48 hours  Answer Assessment - Initial Assessment Questions 1. ONSET: "When did the throat start hurting?" (Hours or days ago)     Last night  2. SEVERITY: "How bad is the sore throat?" (Scale 1-10; mild, moderate or severe)   - MILD (1-3):  doesn't interfere with eating or normal activities   - MODERATE (4-7): interferes with eating some solids and normal activities   - SEVERE (8-10):  excruciating pain, interferes with most normal activities   - SEVERE DYSPHAGIA: can't swallow liquids, drooling     Moderate to severe 3. STREP EXPOSURE: "Has there been any exposure to strep within the past week?" If so, ask: "What type of contact occurred?"      denies 4.  VIRAL SYMPTOMS: "Are there any symptoms of a cold, such as a runny nose, cough, hoarse voice or red eyes?"       5. FEVER: "Do you have a fever?" If so, ask: "What is your temperature, how was it measured, and when did it start?"     denies 6. PUS ON THE TONSILS: "Is there pus on the tonsils in the back of your throat?"     Not that I can see.  alittle white spot on back of  7. OTHER SYMPTOMS: "Do you have any other symptoms?" (e.g., difficulty breathing, headache, rash)     Last nite.   A little difficultly breathing.   8. PREGNANCY: "Is there any chance you are pregnant?" "When was your last menstrual period?"     IUD  Protocols used: SORE THROAT-A-AH

## 2019-06-27 ENCOUNTER — Encounter: Payer: Self-pay | Admitting: Gynecology

## 2019-07-07 ENCOUNTER — Encounter: Payer: Self-pay | Admitting: Emergency Medicine

## 2019-07-07 ENCOUNTER — Other Ambulatory Visit (HOSPITAL_COMMUNITY)
Admission: RE | Admit: 2019-07-07 | Discharge: 2019-07-07 | Disposition: A | Discharge status: Home / Self Care | Payer: BC Managed Care – PPO | Source: Ambulatory Visit | Attending: Emergency Medicine | Admitting: Emergency Medicine

## 2019-07-07 ENCOUNTER — Ambulatory Visit: Payer: BC Managed Care – PPO | Admitting: Emergency Medicine

## 2019-07-07 ENCOUNTER — Other Ambulatory Visit: Payer: Self-pay

## 2019-07-07 VITALS — BP 102/67 | HR 89 | Temp 98.6°F | Ht 62.75 in | Wt 215.4 lb

## 2019-07-07 DIAGNOSIS — R4582 Worries: Secondary | ICD-10-CM | POA: Diagnosis not present

## 2019-07-07 DIAGNOSIS — Z20828 Contact with and (suspected) exposure to other viral communicable diseases: Secondary | ICD-10-CM

## 2019-07-07 DIAGNOSIS — Z202 Contact with and (suspected) exposure to infections with a predominantly sexual mode of transmission: Secondary | ICD-10-CM | POA: Insufficient documentation

## 2019-07-07 MED ORDER — AZITHROMYCIN 500 MG PO TABS: 1000.0000 mg | ORAL_TABLET | Freq: Once | ORAL | 0 refills | Status: AC

## 2019-07-07 NOTE — Progress Notes (Addendum)
Alexis Donaldson 33 y.o.   Chief Complaint  Patient presents with  . Exposure to STD    x 2 weeks ago per patient possible    HISTORY OF PRESENT ILLNESS: This is a 33 y.o. female recently exposed to chlamydia.  Concerned but asymptomatic.   HPI   Prior to Admission medications   Medication Sig Start Date End Date Taking? Authorizing Provider  albuterol (PROVENTIL HFA;VENTOLIN HFA) 108 (90 Base) MCG/ACT inhaler Inhale 2 puffs into the lungs every 4 (four) hours as needed for wheezing or shortness of breath (cough, shortness of breath or wheezing.). 01/14/17  Yes Timmothy Euler, [place] D, PA-C  amLODipine (NORVASC) 5 MG tablet Take 1 tablet (5 mg total) by mouth daily. 01/31/19  Yes Lorice Lafave, Ines Bloomer, MD  beclomethasone (QVAR) 40 MCG/ACT inhaler Inhale 1 puff into the lungs 2 (two) times daily. 01/03/19  Yes Alima Naser, Ines Bloomer, MD  cetirizine (ZYRTEC) 10 MG tablet Take 1 tablet (10 mg total) by mouth daily. 01/03/19  Yes Emilianna Barlowe, Ines Bloomer, MD  cyclobenzaprine (FLEXERIL) 5 MG tablet Take 1 tablet (5 mg total) by mouth daily as needed for muscle spasms. 03/07/19  Yes Juri Dinning, Ines Bloomer, MD  fluticasone [facility]) 50 MCG/ACT nasal spray Place 2 sprays into both nostrils daily. 01/03/19  Yes Creedence Kunesh, Ines Bloomer, MD  hydrOXYzine (ATARAX/VISTARIL) 25 MG tablet Take 1-2 tablets (25-50 mg total) by mouth 3 (three) times daily as needed. 01/03/19  Yes Zaynab Chipman, Ines Bloomer, MD  ibuprofen (ADVIL,MOTRIN) 800 MG tablet Take 1 tablet (800 mg total) by mouth every 6 (six) hours as needed (as needed for pain). 01/04/19  Yes Alford Gamero, Ines Bloomer, MD  levonorgestrel [facility]) 20 MCG/24HR IUD by Intrauterine route.   Yes [provider]  lisinopril (ZESTRIL) 10 MG tablet Take 1 tablet (10 mg total) by mouth daily. 02/09/19  Yes Amos Gaber, Ines Bloomer, MD  montelukast (SINGULAIR) 10 MG tablet Take 1 tablet (10 mg total) by mouth at bedtime. 01/31/19  Yes Loyed Wilmes, Ines Bloomer, MD  Blood Pressure  Monitoring (BLOOD PRESSURE KIT) DEVI 1 Units by Does not apply route daily. 12/12/16   Tenna Delaine D, PA-C    Allergies  Allergen Reactions  . Effexor [Venlafaxine] Anxiety  . Zoloft [Sertraline Hcl] Anxiety and Other (See Comments)    Patient Active Problem List   Diagnosis Date Noted  . Chronic bilateral low back pain without sciatica 01/03/2019  . Chronic anxiety 01/03/2019  . Essential hypertension 01/03/2019    Past Medical History:  Diagnosis Date  . Allergy   . Anxiety   . Asthma   . Cervical dysplasia   . Depression   . HSV-1 (herpes simplex virus 1) infection   . HSV-2 (herpes simplex virus 2) infection   . Hypertension   . STD (sexually transmitted disease)    Chlamydia X2,HSV    Past Surgical History:  Procedure Laterality Date  . CERVICAL BIOPSY    . COLPOSCOPY    . GYNECOLOGIC CRYOSURGERY    . Mirena     Inserted 04/07/2018  . WISDOM TOOTH EXTRACTION      Social History   Socioeconomic History  . Marital status: Single    Spouse name: Not on file  . Number of children: 0  . Years of education: Not on file  . Highest education level: Not on file  Occupational History  . Not on file  Social Needs  . Financial resource strain: Not on file  . Food insecurity    Worry: Not on file  Inability: Not on file  . Transportation needs    Medical: Not on file    Non-medical: Not on file  Tobacco Use  . Smoking status: Former Smoker    Packs/day: 0.25    Years: 4.00    Pack years: 1.00    Types: Cigarettes  . Smokeless tobacco: Never Used  Substance and Sexual Activity  . Alcohol use: Yes    Alcohol/week: 8.0 standard drinks    Types: 4 Glasses of wine, 1 Cans of beer, 3 Shots of liquor per week  . Drug use: Yes    Types: Marijuana    Comment: Occas  . Sexual activity: Yes    Birth control/protection: I.U.D.    Comment: Mirena inserted 04/07/2018-1st intercourse 33 yo-More than 5 partners  Lifestyle  . Physical activity    Days per week:  Not on file    Minutes per session: Not on file  . Stress: Not on file  Relationships  . Social Herbalist on phone: Not on file    Gets together: Not on file    Attends religious service: Not on file    Active member of club or organization: Not on file    Attends meetings of clubs or organizations: Not on file    Relationship status: Not on file  . Intimate partner violence    Fear of current or ex partner: Not on file    Emotionally abused: Not on file    Physically abused: Not on file    Forced sexual activity: Not on file  Other Topics Concern  . Not on file  Social History Narrative   Program supervisor    Family History  Problem Relation Age of Onset  . Hyperlipidemia Mother   . Hypertension Mother   . Hypertension Father   . Heart attack Father   . Mental illness Sister   . Diabetes Maternal Grandmother   . Cancer Maternal Grandmother        Lung  . Cancer Maternal Grandfather        Lung  . Hypertension Maternal Grandfather   . Heart disease Maternal Grandfather   . Mental illness Paternal Grandmother   . Hypertension Paternal Grandfather   . Stroke Paternal Grandfather   . Mental illness Sister      Review of Systems  Constitutional: Negative.  Negative for chills and fever.  HENT: Negative.  Negative for congestion and sore throat.   Eyes: Negative for discharge and redness.  Respiratory: Negative.  Negative for cough and shortness of breath.   Cardiovascular: Negative.  Negative for chest pain and palpitations.  Gastrointestinal: Negative for abdominal pain, diarrhea, nausea and vomiting.  Genitourinary: Negative for dysuria, frequency, hematuria and urgency.  Musculoskeletal: Negative.  Negative for myalgias.  Skin: Negative.  Negative for rash.  Neurological: Negative for dizziness and headaches.  Endo/Heme/Allergies: Negative.   All other systems reviewed and are negative.  Vitals:   07/07/19 1328  BP: 102/67  Pulse: 89  Temp: 98.6  F (37 C)     Physical Exam Vitals signs reviewed.  Constitutional:      Appearance: Normal appearance.  HENT:     Head: Normocephalic.  Eyes:     Extraocular Movements: Extraocular movements intact.     Pupils: Pupils are equal, round, and reactive to light.  Neck:     Musculoskeletal: Normal range of motion.  Cardiovascular:     Rate and Rhythm: Normal rate and regular rhythm.  Heart sounds: Normal heart sounds.  Pulmonary:     Effort: Pulmonary effort is normal.     Breath sounds: Normal breath sounds.  Abdominal:     Palpations: Abdomen is soft.     Tenderness: There is no abdominal tenderness.  Musculoskeletal: Normal range of motion.  Skin:    General: Skin is warm and dry.     Capillary Refill: Capillary refill takes less than 2 seconds.  Neurological:     General: No focal deficit present.     Mental Status: She is alert and oriented to person, place, and time.  Psychiatric:        Mood and Affect: Mood normal.        Behavior: Behavior normal.    A total of 25 minutes was spent in the room with the patient, greater than 50% of which was in counseling/coordination of care regarding sexually transmitted diseases, prevention, treatment, review of recent testing, need for present testing, medications and side effects, prognosis and need for follow-up if no better or worse.   ASSESSMENT & PLAN: Alexis Donaldson was seen today for exposure to std.  Diagnoses and all orders for this visit:  Worries  STD exposure -     STD Panel  Exposure to chlamydia -     GC/Chlamydia probe amp ([place])not at [facility] -     azithromycin (ZITHROMAX) 500 MG tablet; Take 2 tablets (1,000 mg total) by mouth once for 1 dose.  Exposure to herpes simplex virus (HSV)     Patient Instructions       If you have lab work done today you will be contacted with your lab results within the next 2 weeks.  If you have not heard from [place] then please contact us. The fastest way to get your  results is to register for My Chart.   IF you received an x-ray today, you will receive an invoice from [facility]. Please contact [facility] at [phone] with questions or concerns regarding your invoice.   IF you received labwork today, you will receive an invoice from [facility]. Please contact [facility] at [phone] with questions or concerns regarding your invoice.   Our billing staff will not be able to assist you with questions regarding bills from these companies.  You will be contacted with the lab results as soon as they are available. The fastest way to get your results is to activate your My Chart account. Instructions are located on the last page of this paperwork. If you have not heard from [place] regarding the results in 2 weeks, please contact this office.     Preventing Sexually Transmitted Infections, Adult Sexually transmitted infections (STIs) are diseases that are passed (transmitted) from person to person through bodily fluids exchanged during sex or sexual contact. Bodily fluids include saliva, semen, blood, vaginal mucus, and urine. You may have an increased risk for developing an STI if you have unprotected oral, vaginal, or anal sex. Some common STIs include:  Herpes.  Hepatitis B.  Chlamydia.  Gonorrhea.  Syphilis.  HPV (human papillomavirus).  HIV (human immunodeficiency virus), the virus that can cause AIDS (acquired immunodeficiency syndrome). How can I protect myself from sexually transmitted infections? The only way to completely prevent STIs is not to have sex of any kind (practice abstinence). This includes oral, vaginal, or anal sex. If you are sexually active, take these actions to lower your risk of getting an STI:  Have only one sex partner (be monogamous) or limit the  number of sexual partners you have.  Stay up-to-date on immunizations. Certain vaccines can lower your risk of getting certain STIs, such as: ? Hepatitis A  and B vaccines. You may have been vaccinated as a young child, but likely need a booster shot as a teen or young adult. ? HPV vaccine.  Use methods that prevent the exchange of body fluids between partners (barrier protection) every time you have sex. Barrier protection can be used during oral, vaginal, or anal sex. Commonly used barrier methods include: ? Female condom. ? Female condom. ? Dental dam.  Get tested regularly for STIs. Have your sexual partner get tested regularly as well.  Avoid mixing alcohol, drugs, and sex. Alcohol and drug use can affect your ability to make good decisions and can lead to risky sexual behaviors.  Ask your health care provider about taking pre-exposure prophylaxis (PrEP) to prevent HIV infection if you: ? Have a HIV-positive sexual partner. ? Have multiple sexual partners or partners who do not know their HIV status, and do not regularly use a condom during sex. ? Use injection drugs and share needles. Birth control pills, injections, implants, and intrauterine devices (IUDs) do not protect against STIs. To prevent both STIs and pregnancy, always use a condom with another form of birth control. Some STIs, such as herpes, are spread through skin to skin contact. A condom does not protect you from getting such STIs. If you or your partner have herpes and there is an active flare with open sores, avoid all sexual contact. Why are these changes important? Taking steps to practice safe sex protects you and others. Many STIs can be cured. However, some STIs are not curable and will affect you for the rest of your life. STIs can be passed on to another person even if you do not have symptoms. What can happen if changes are not made? Certain STIs may:  Require you to take medicine for the rest of your life.  Affect your ability to have children (your fertility).  Increase your risk for developing another STI or certain serious health conditions, such as: ?  Cervical cancer. ? Head and neck cancer. ? Pelvic inflammatory disease (PID) in women. ? Organ damage or damage to other parts of your body, if the infection spreads.  Be passed to a baby during childbirth. How are sexually transmitted infections treated? If you or your partner know or think that you may have an STI:  Talk with your health care provider about what can be done to treat it. Some STIs can be treated and cured with medicines.  For curable STIs, you and your partner should avoid sex during treatment and for several days after treatment is complete.  You and your partner should both be treated at the same time, if there is any chance that your partner is infected as well. If you get treatment but your partner does not, your partner can re-infect you when you resume sexual contact.  Do not have unprotected sex. Where to find more information Learn more about sexually transmitted diseases and infections from:  Centers for Disease Control and Prevention: ? More information about specific STIs: AppraiserFraud.fi ? Find places to get sexual health counseling and treatment for free or for a low cost: gettested.StoreMirror.com.cy  U.S. Department of Health and Human Services: http://white.info/.html Summary  The only way to completely prevent STIs is not to have sex (practice abstinence), including oral, vaginal, or anal sex.  STIs can spread through saliva, semen,  blood, vaginal mucus, urine, or sexual contact.  If you do have sex, limit your number of sexual partners and use a barrier protection method every time you have sex.  If you develop an STI, get treated right away and ask your partner to be treated as well. Do not resume having sex until both of you have completed treatment for the STI. This information is not intended to replace advice given to you by your health care provider. Make sure you discuss any  questions you have with your health care provider. Document Released: 09/18/2016 Document Revised: 02/26/2018 Document Reviewed: 09/18/2016 Elsevier Patient Education  2020 Elsevier Inc.      Agustina Caroli, MD [facility]

## 2019-07-07 NOTE — Patient Instructions (Addendum)
   If you have lab work done today you will be contacted with your lab results within the next 2 weeks.  If you have not heard from us then please contact us. The fastest way to get your results is to register for My Chart.   IF you received an x-ray today, you will receive an invoice from [facility]. Please contact [facility] at [phone] with questions or concerns regarding your invoice.   IF you received labwork today, you will receive an invoice from [facility]. Please contact [facility] at [phone] with questions or concerns regarding your invoice.   Our billing staff will not be able to assist you with questions regarding bills from these companies.  You will be contacted with the lab results as soon as they are available. The fastest way to get your results is to activate your My Chart account. Instructions are located on the last page of this paperwork. If you have not heard from us regarding the results in 2 weeks, please contact this office.      Preventing Sexually Transmitted Infections, Adult Sexually transmitted infections (STIs) are diseases that are passed (transmitted) from person to person through bodily fluids exchanged during sex or sexual contact. Bodily fluids include saliva, semen, blood, vaginal mucus, and urine. You may have an increased risk for developing an STI if you have unprotected oral, vaginal, or anal sex. Some common STIs include:  Herpes.  Hepatitis B.  Chlamydia.  Gonorrhea.  Syphilis.  HPV (human papillomavirus).  HIV (human immunodeficiency virus), the virus that can cause AIDS (acquired immunodeficiency syndrome). How can I protect myself from sexually transmitted infections? The only way to completely prevent STIs is not to have sex of any kind (practice abstinence). This includes oral, vaginal, or anal sex. If you are sexually active, take these actions to lower your risk of getting an STI:  Have only one sex  partner (be monogamous) or limit the number of sexual partners you have.  Stay up-to-date on immunizations. Certain vaccines can lower your risk of getting certain STIs, such as: ? Hepatitis A and B vaccines. You may have been vaccinated as a young child, but likely need a booster shot as a teen or young adult. ? HPV vaccine.  Use methods that prevent the exchange of body fluids between partners (barrier protection) every time you have sex. Barrier protection can be used during oral, vaginal, or anal sex. Commonly used barrier methods include: ? Female condom. ? Female condom. ? Dental dam.  Get tested regularly for STIs. Have your sexual partner get tested regularly as well.  Avoid mixing alcohol, drugs, and sex. Alcohol and drug use can affect your ability to make good decisions and can lead to risky sexual behaviors.  Ask your health care provider about taking pre-exposure prophylaxis (PrEP) to prevent HIV infection if you: ? Have a HIV-positive sexual partner. ? Have multiple sexual partners or partners who do not know their HIV status, and do not regularly use a condom during sex. ? Use injection drugs and share needles. Birth control pills, injections, implants, and intrauterine devices (IUDs) do not protect against STIs. To prevent both STIs and pregnancy, always use a condom with another form of birth control. Some STIs, such as herpes, are spread through skin to skin contact. A condom does not protect you from getting such STIs. If you or your partner have herpes and there is an active flare with open sores, avoid all sexual contact. Why are these   important? Taking steps to practice safe sex protects you and others. Many STIs can be cured. However, some STIs are not curable and will affect you for the rest of your life. STIs can be passed on to another person even if you do not have symptoms. What can happen if changes are not made? Certain STIs may:  Require you to take  medicine for the rest of your life.  Affect your ability to have children (your fertility).  Increase your risk for developing another STI or certain serious health conditions, such as: ? Cervical cancer. ? Head and neck cancer. ? Pelvic inflammatory disease (PID) in women. ? Organ damage or damage to other parts of your body, if the infection spreads.  Be passed to a baby during childbirth. How are sexually transmitted infections treated? If you or your partner know or think that you may have an STI:  Talk with your health care provider about what can be done to treat it. Some STIs can be treated and cured with medicines.  For curable STIs, you and your partner should avoid sex during treatment and for several days after treatment is complete.  You and your partner should both be treated at the same time, if there is any chance that your partner is infected as well. If you get treatment but your partner does not, your partner can re-infect you when you resume sexual contact.  Do not have unprotected sex. Where to find more information Learn more about sexually transmitted diseases and infections from:  Centers for Disease Control and Prevention: ? More information about specific STIs: AppraiserFraud.fi ? Find places to get sexual health counseling and treatment for free or for a low cost: gettested.StoreMirror.com.cy  U.S. Department of Health and Human Services: http://white.info/.html Summary  The only way to completely prevent STIs is not to have sex (practice abstinence), including oral, vaginal, or anal sex.  STIs can spread through saliva, semen, blood, vaginal mucus, urine, or sexual contact.  If you do have sex, limit your number of sexual partners and use a barrier protection method every time you have sex.  If you develop an STI, get treated right away and ask your partner to be treated as well. Do not  resume having sex until both of you have completed treatment for the STI. This information is not intended to replace advice given to you by your health care provider. Make sure you discuss any questions you have with your health care provider. Document Released: 09/18/2016 Document Revised: 02/26/2018 Document Reviewed: 09/18/2016 Elsevier Patient Education  2020 [employer].

## 2019-07-08 ENCOUNTER — Telehealth: Payer: Self-pay | Admitting: Emergency Medicine

## 2019-07-08 LAB — GC/CHLAMYDIA PROBE AMP (~~LOC~~) NOT AT ARMC
Chlamydia: NEGATIVE
Neisseria Gonorrhea: NEGATIVE

## 2019-07-08 NOTE — Telephone Encounter (Signed)
Pt is irate because we can't release them. And she was told that we have results and she is upset that we cant give her results . Please review results and call pt asap fr

## 2019-07-08 NOTE — Telephone Encounter (Signed)
PT IS ANXIOUS ABOUT STD LAB RESULTS .PLEASE CALL WITH RESULTS .   FR

## 2019-07-08 NOTE — Telephone Encounter (Signed)
Spoke with pt an advised her labs have not been released by [place] and we aren't allowed to give results without release from provider.  Advised Mitchel Honour out today and will not be back until Monday.  Per pt she is very anxious for results and has already taken 4 anxiety pills and feels she will have a panic attack before Monday if she doesn't get some results she only has tow anxiety pills and she will not make it until Monday.  Pt is requesting if another provider and release her results I advised no but will double check with clinic manager.  Pt is requesting c/b from clinic manager regarding this matter.  Advised message would be sent. Pt agreeable.

## 2019-07-09 ENCOUNTER — Encounter: Payer: Self-pay | Admitting: Emergency Medicine

## 2019-07-09 LAB — RPR+HSVIGM+HBSAG+HSV2(IGG)+...
HIV Screen 4th Generation wRfx: NONREACTIVE
HSV 2 IgG, Type Spec: 8.27 index — ABNORMAL HIGH (ref 0.00–0.90)
HSVI/II Comb IgM: <0.91 Ratio (ref 0.00–0.90)
Hepatitis B Surface Ag: NEGATIVE
RPR Ser Ql: NONREACTIVE

## 2019-07-12 NOTE — Telephone Encounter (Signed)
Pt has been informed of labs from provider.

## 2019-07-22 IMAGING — CT CT HEAD WITHOUT CONTRAST
3 series · 16 of 45 positions shown, 19 images · non-contrast
Comparison: None.

CLINICAL DATA: Right facial and right-sided upper and lower
extremity numbness

EXAM:
CT HEAD WITHOUT CONTRAST
TECHNIQUE: Contiguous axial images were obtained from the base of the skull
through the vertex without intravenous contrast.

[Series 2: head wo · axial · 0.40mm/px · z∈[+649,+764]mm · 10 of 28 slices shown, 13 images]
[im 3/28  brain]
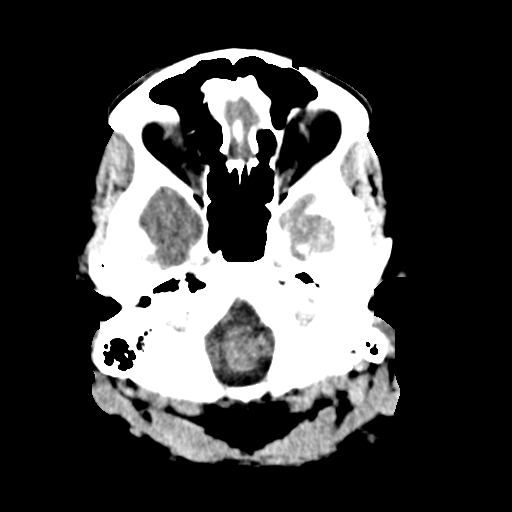
[im 3/28  bone]
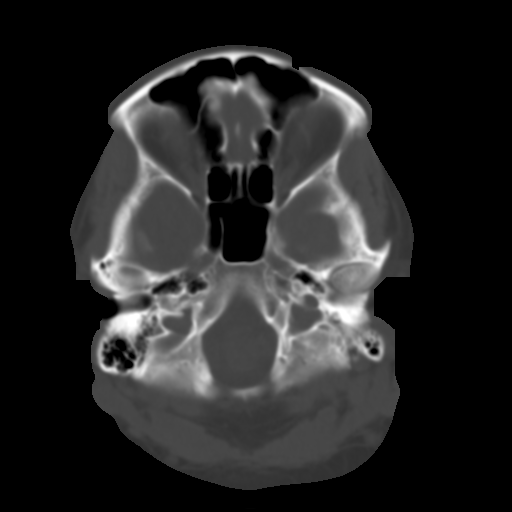
[im 5/28  brain]
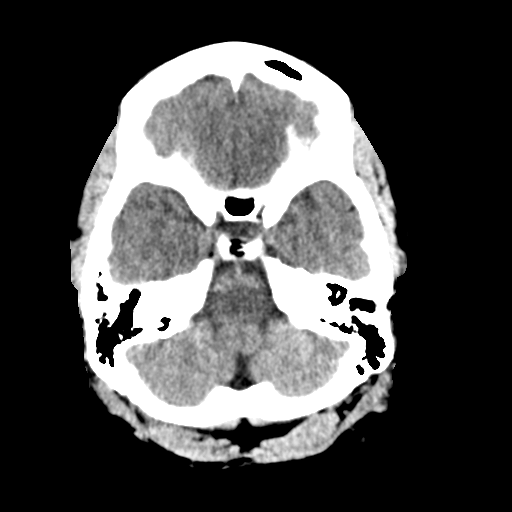
[im 8/28  brain]
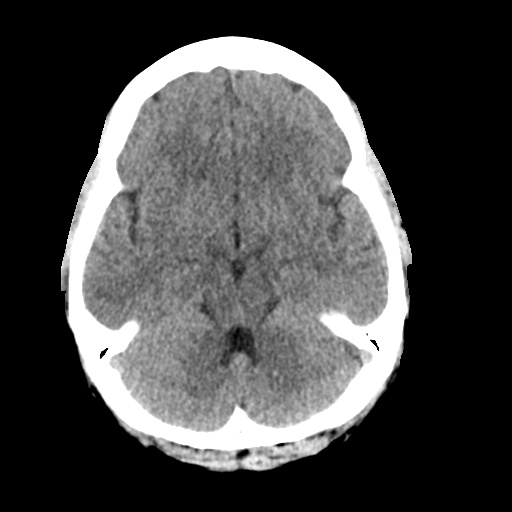
[im 11/28  brain]
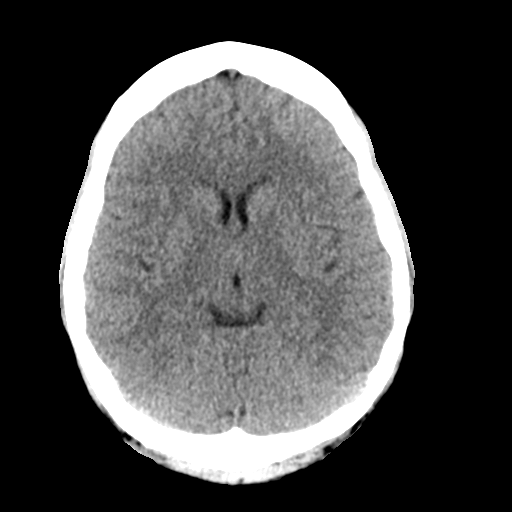
[im 13/28  brain]
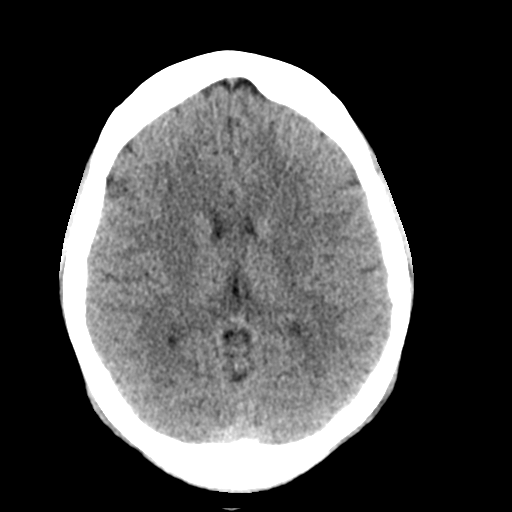
[im 13/28  bone]
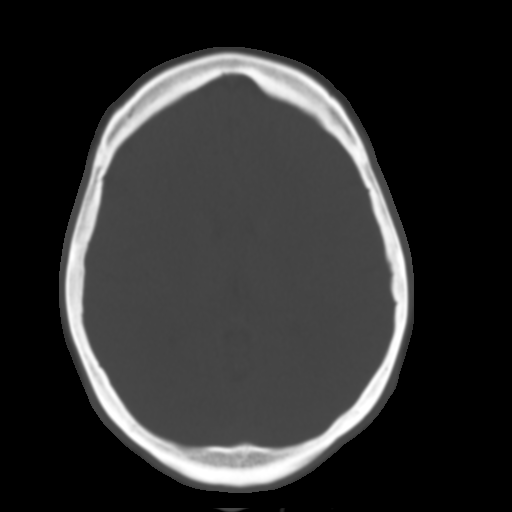
[im 16/28  brain]
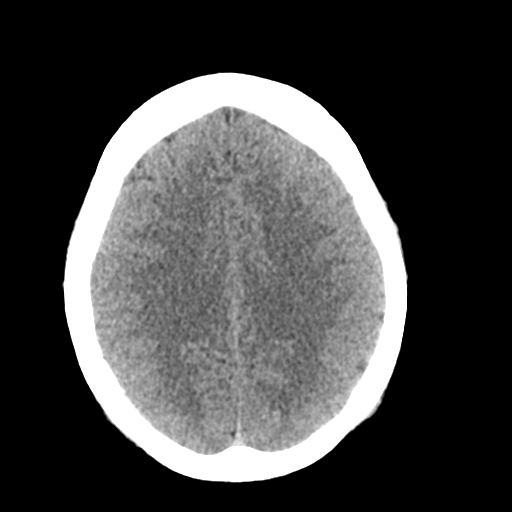
[im 18/28  brain]
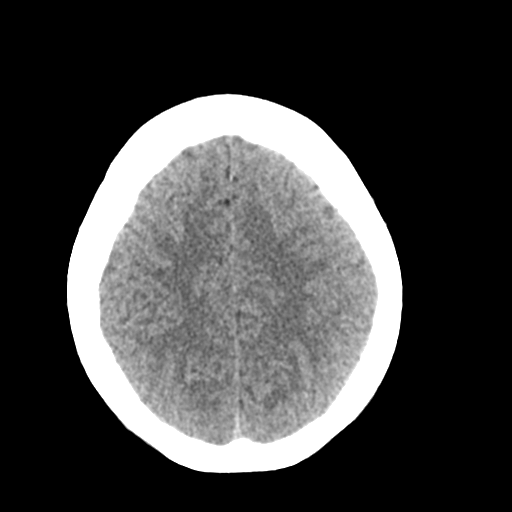
[im 21/28  brain]
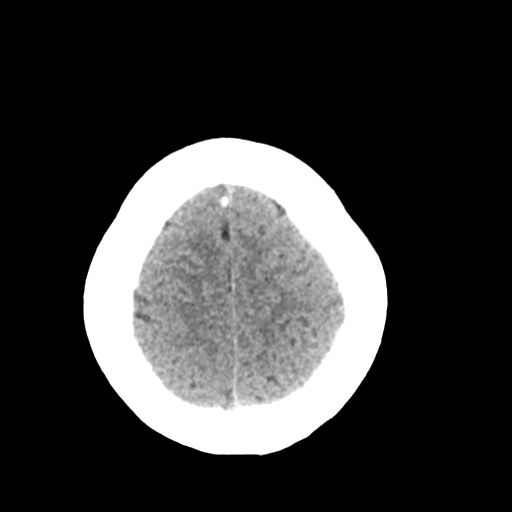
[im 24/28  brain]
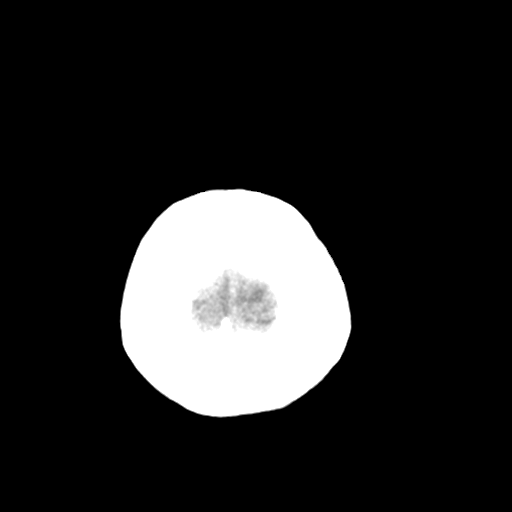
[im 24/28  bone]
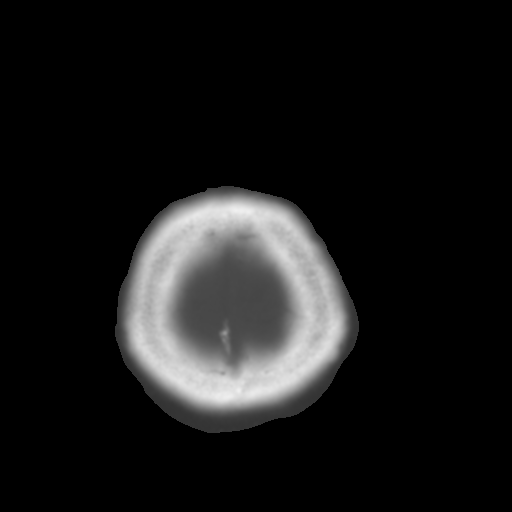
[im 26/28  brain]
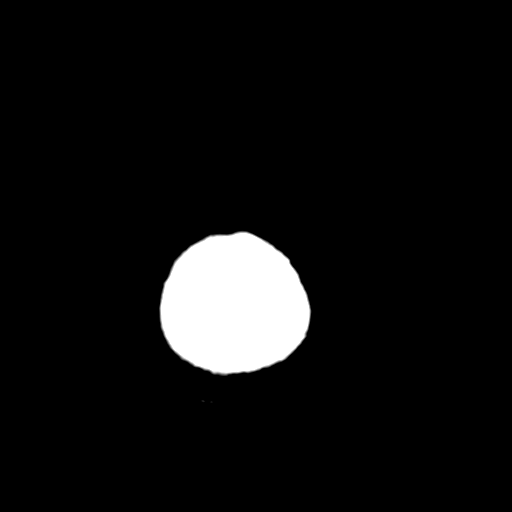

[Series 4: coronal soft tissue · coronal · 0.31mm/px · 3 of 65 slices shown]
[im 22/65  brain]
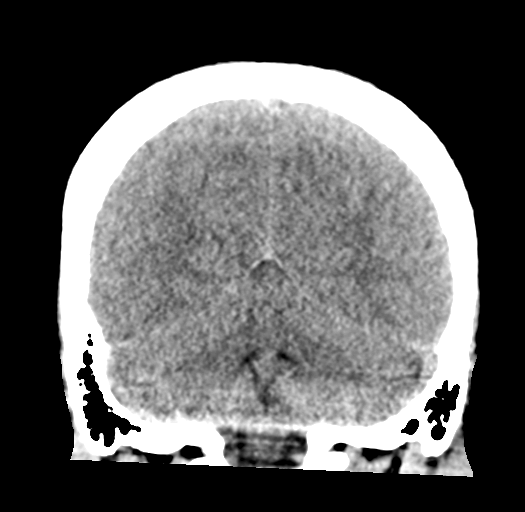
[im 29/65  brain]
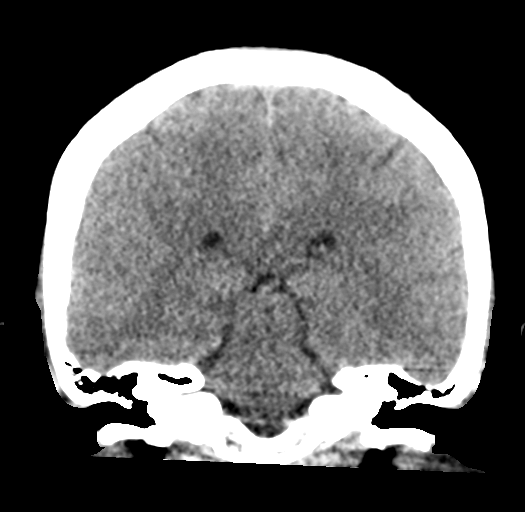
[im 36/65  brain]
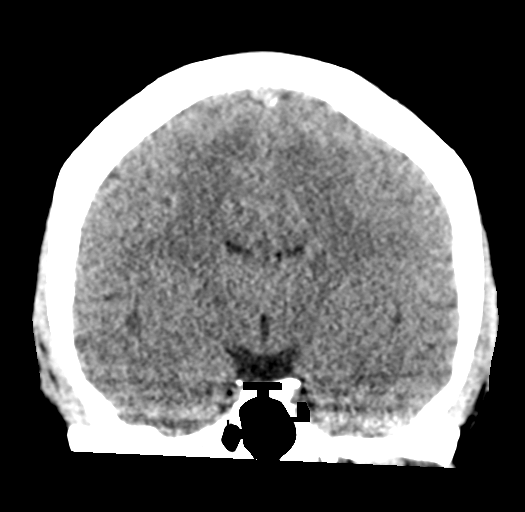

[Series 5: sagittal soft tissue · sagittal · 0.31mm/px · 3 of 54 slices shown]
[im 18/54  brain]
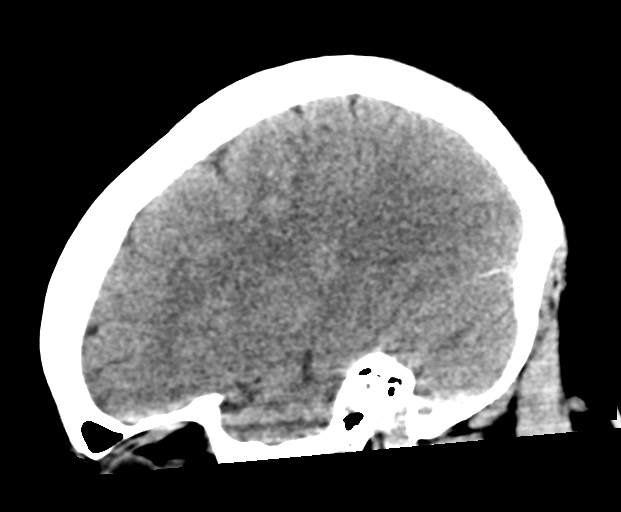
[im 27/54  brain]
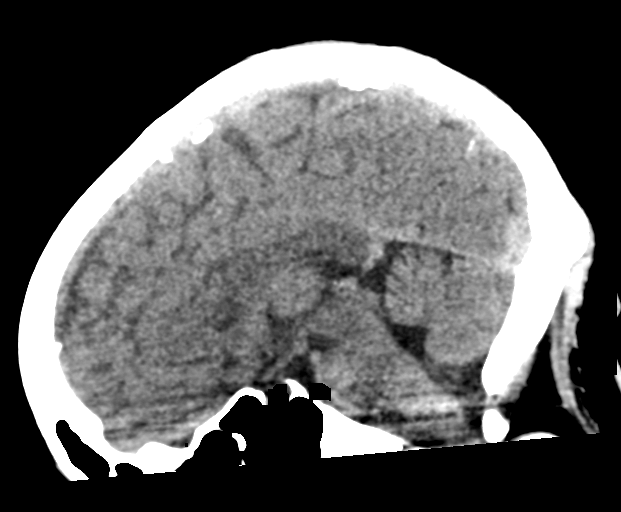
[im 36/54  brain]
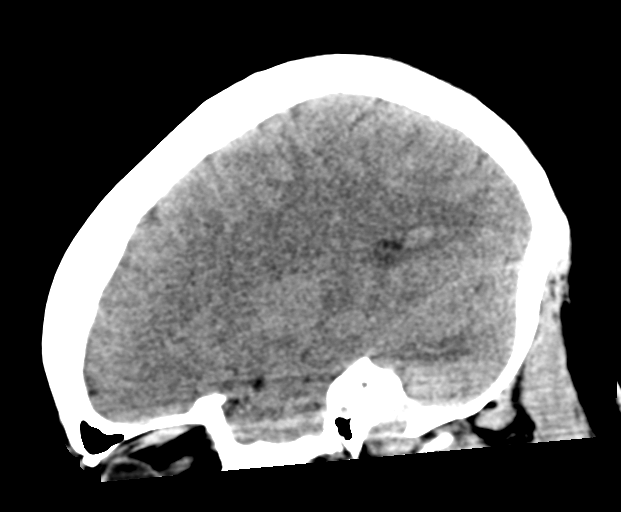

[16 of 45 positions shown; findings below may reference images not displayed]

FINDINGS: Brain: The ventricles are normal in size and configuration. There is
no intracranial mass, hemorrhage, extra-axial fluid collection, or
midline shift. Brain parenchyma appears unremarkable. No evident
acute infarct.

Vascular: No hyperdense vessel. There is no appreciable vascular
calcification.

Skull: The bony calvarium appears intact.

Sinuses/Orbits: Visualized paranasal sinuses are clear. Visualized
orbits appear symmetric bilaterally.

Other: Mastoid air cells are clear.
IMPRESSION: Study within normal limits.

## 2019-08-17 ENCOUNTER — Encounter: Payer: Self-pay | Admitting: Emergency Medicine

## 2019-08-17 ENCOUNTER — Ambulatory Visit: Payer: BC Managed Care – PPO | Admitting: Emergency Medicine

## 2019-08-17 ENCOUNTER — Other Ambulatory Visit: Payer: Self-pay

## 2019-08-17 VITALS — BP 143/96 | HR 97 | Temp 98.0°F | Resp 16 | Ht 62.75 in | Wt 213.8 lb

## 2019-08-17 DIAGNOSIS — F419 Anxiety disorder, unspecified: Secondary | ICD-10-CM

## 2019-08-17 DIAGNOSIS — Z8679 Personal history of other diseases of the circulatory system: Secondary | ICD-10-CM

## 2019-08-17 DIAGNOSIS — F411 Generalized anxiety disorder: Secondary | ICD-10-CM

## 2019-08-17 MED ORDER — BUSPIRONE HCL 7.5 MG PO TABS: 7.5000 mg | ORAL_TABLET | Freq: Two times a day (BID) | ORAL | 1 refills | Status: AC

## 2019-08-17 NOTE — Progress Notes (Signed)
Alexis Donaldson 33 y.o.   Chief Complaint  Patient presents with  . Referral    Pysch to get increase on medication    HISTORY OF PRESENT ILLNESS: This is a 33 y.o. female complaining of increased anxiety over the past several weeks. Has a history of chronic anxiety since age 71.  Has been tried on Effexor and Zoloft but that makes her anxiety worse. Has also tried a Xanax and trazodone with partial relief. Changes at work bringing on increased anxiety levels. Sees a therapist on a regular basis but needs to be referred to psychiatry for medication consideration.   Depression screen [facility] 2/9 08/17/2019 07/07/2019 04/04/2019 02/09/2019 01/31/2019  Decreased Interest 2 0 0 1 1  Down, Depressed, Hopeless 2 0 0 1 1  PHQ - 2 Score 4 0 0 2 2  Altered sleeping 3 - - 3 3  Tired, decreased energy 2 - - 1 1  Change in appetite 1 - - 0 0  Feeling bad or failure about yourself  1 - - 0 0  Trouble concentrating 2 - - 0 0  Moving slowly or fidgety/restless 2 - - 1 1  Suicidal thoughts 0 - - 0 0  PHQ-9 Score 15 - - 7 7  Difficult doing work/chores - - - - -   GAD 7 : Generalized Anxiety Score 04/15/2017  Nervous, Anxious, on Edge 0  Control/stop worrying 1  Worry too much - different things 0  Trouble relaxing 0  Restless 0  Easily annoyed or irritable 2  Afraid - awful might happen 0  Total GAD 7 Score 3  Anxiety Difficulty Not difficult at all     HPI   Prior to Admission medications   Medication Sig Start Date End Date Taking? Authorizing Provider  albuterol (PROVENTIL HFA;VENTOLIN HFA) 108 (90 Base) MCG/ACT inhaler Inhale 2 puffs into the lungs every 4 (four) hours as needed for wheezing or shortness of breath (cough, shortness of breath or wheezing.). 01/14/17  Yes Timmothy Euler, [place] D, PA-C  amLODipine (NORVASC) 5 MG tablet Take 1 tablet (5 mg total) by mouth daily. 01/31/19  Yes Jarmon Javid, Ines Bloomer, MD  beclomethasone (QVAR) 40 MCG/ACT inhaler Inhale 1 puff into the lungs 2 (two)  times daily. 01/03/19  Yes Sindia Kowalczyk, Ines Bloomer, MD  cetirizine (ZYRTEC) 10 MG tablet Take 1 tablet (10 mg total) by mouth daily. 01/03/19  Yes Evia Goldsmith, Ines Bloomer, MD  cyclobenzaprine (FLEXERIL) 5 MG tablet Take 1 tablet (5 mg total) by mouth daily as needed for muscle spasms. 03/07/19  Yes Quintan Saldivar, Ines Bloomer, MD  fluticasone [facility]) 50 MCG/ACT nasal spray Place 2 sprays into both nostrils daily. 01/03/19  Yes Dannell Raczkowski, Ines Bloomer, MD  hydrOXYzine (ATARAX/VISTARIL) 25 MG tablet Take 1-2 tablets (25-50 mg total) by mouth 3 (three) times daily as needed. 01/03/19  Yes Emmalynn Pinkham, Ines Bloomer, MD  ibuprofen (ADVIL,MOTRIN) 800 MG tablet Take 1 tablet (800 mg total) by mouth every 6 (six) hours as needed (as needed for pain). 01/04/19  Yes Jovann Luse, Ines Bloomer, MD  lisinopril (ZESTRIL) 10 MG tablet Take 1 tablet (10 mg total) by mouth daily. 02/09/19  Yes Ulice Follett, Ines Bloomer, MD  montelukast (SINGULAIR) 10 MG tablet Take 1 tablet (10 mg total) by mouth at bedtime. 01/31/19  Yes Joli Koob, Ines Bloomer, MD  Blood Pressure Monitoring (BLOOD PRESSURE KIT) DEVI 1 Units by Does not apply route daily. 12/12/16   Tenna Delaine D, PA-C  levonorgestrel (MIRENA) 20 MCG/24HR IUD by Intrauterine route.  [provider]    Allergies  Allergen Reactions  . Effexor [Venlafaxine] Anxiety  . Zoloft [Sertraline Hcl] Anxiety and Other (See Comments)    Patient Active Problem List   Diagnosis Date Noted  . Chronic bilateral low back pain without sciatica 01/03/2019  . Chronic anxiety 01/03/2019  . Essential hypertension 01/03/2019    Past Medical History:  Diagnosis Date  . Allergy   . Anxiety   . Asthma   . Cervical dysplasia   . Depression   . HSV-1 (herpes simplex virus 1) infection   . HSV-2 (herpes simplex virus 2) infection   . Hypertension   . STD (sexually transmitted disease)    Chlamydia X2,HSV    Past Surgical History:  Procedure Laterality Date  . CERVICAL BIOPSY    .  COLPOSCOPY    . GYNECOLOGIC CRYOSURGERY    . Mirena     Inserted 04/07/2018  . WISDOM TOOTH EXTRACTION      Social History   Socioeconomic History  . Marital status: Single    Spouse name: Not on file  . Number of children: 0  . Years of education: Not on file  . Highest education level: Not on file  Occupational History  . Not on file  Social Needs  . Financial resource strain: Not on file  . Food insecurity    Worry: Not on file    Inability: Not on file  . Transportation needs    Medical: Not on file    Non-medical: Not on file  Tobacco Use  . Smoking status: Former Smoker    Packs/day: 0.25    Years: 4.00    Pack years: 1.00    Types: Cigarettes  . Smokeless tobacco: Never Used  Substance and Sexual Activity  . Alcohol use: Yes    Alcohol/week: 8.0 standard drinks    Types: 4 Glasses of wine, 1 Cans of beer, 3 Shots of liquor per week  . Drug use: Yes    Types: Marijuana    Comment: Occas  . Sexual activity: Yes    Birth control/protection: I.U.D.    Comment: Mirena inserted 04/07/2018-1st intercourse 33 yo-More than 5 partners  Lifestyle  . Physical activity    Days per week: Not on file    Minutes per session: Not on file  . Stress: Not on file  Relationships  . Social Herbalist on phone: Not on file    Gets together: Not on file    Attends religious service: Not on file    Active member of club or organization: Not on file    Attends meetings of clubs or organizations: Not on file    Relationship status: Not on file  . Intimate partner violence    Fear of current or ex partner: Not on file    Emotionally abused: Not on file    Physically abused: Not on file    Forced sexual activity: Not on file  Other Topics Concern  . Not on file  Social History Narrative   Program supervisor    Family History  Problem Relation Age of Onset  . Hyperlipidemia Mother   . Hypertension Mother   . Hypertension Father   . Heart attack Father   . Mental  illness Sister   . Diabetes Maternal Grandmother   . Cancer Maternal Grandmother        Lung  . Cancer Maternal Grandfather        Lung  . Hypertension  Maternal Grandfather   . Heart disease Maternal Grandfather   . Mental illness Paternal Grandmother   . Hypertension Paternal Grandfather   . Stroke Paternal Grandfather   . Mental illness Sister      Review of Systems  Constitutional: Negative.  Negative for chills, fever and weight loss.  HENT: Negative.  Negative for congestion and sore throat.   Respiratory: Negative.  Negative for cough and shortness of breath.   Cardiovascular: Negative.  Negative for chest pain and palpitations.  Gastrointestinal: Negative.  Negative for abdominal pain, blood in stool, diarrhea, nausea and vomiting.  Genitourinary: Negative.  Negative for dysuria and hematuria.  Musculoskeletal: Negative for back pain, myalgias and neck pain.  Skin: Negative.  Negative for rash.  Neurological: Negative.  Negative for dizziness and headaches.  All other systems reviewed and are negative.   Vitals:   08/17/19 0951  BP: (!) 143/96  Pulse: 97  Resp: 16  Temp: 98 F (36.7 C)  SpO2: 99%   BP Readings from Last 3 Encounters:  08/17/19 (!) 143/96  07/07/19 102/67  04/04/19 123/80    Physical Exam Vitals signs reviewed.  Constitutional:      Appearance: Normal appearance.  HENT:     Head: Normocephalic.  Eyes:     Extraocular Movements: Extraocular movements intact.     Pupils: Pupils are equal, round, and reactive to light.  Neck:     Musculoskeletal: Normal range of motion and neck supple.  Cardiovascular:     Rate and Rhythm: Normal rate and regular rhythm.     Pulses: Normal pulses.     Heart sounds: Normal heart sounds.  Pulmonary:     Effort: Pulmonary effort is normal.     Breath sounds: Normal breath sounds.  Musculoskeletal: Normal range of motion.  Skin:    General: Skin is warm and dry.  Neurological:     General: No focal  deficit present.     Mental Status: She is alert and oriented to person, place, and time.  Psychiatric:        Mood and Affect: Mood normal.        Behavior: Behavior normal.     Comments: Teary     A total of 25 minutes was spent in the room with the patient, greater than 50% of which was in counseling/coordination of care regarding chronic anxiety and ways to manage it, medication and side effects, diet and nutrition as well as lifestyle choices, coping mechanisms, need for psychiatry evaluation, prognosis and need for follow-up.  ASSESSMENT & PLAN: Tamaka was seen today for referral.  Diagnoses and all orders for this visit:  Generalized anxiety disorder -     busPIRone (BUSPAR) 7.5 MG tablet; Take 1 tablet (7.5 mg total) by mouth 2 (two) times daily. -     Ambulatory referral to Psychiatry  Chronic anxiety  History of hypertension    Patient Instructions       If you have lab work done today you will be contacted with your lab results within the next 2 weeks.  If you have not heard from [place] then please contact us. The fastest way to get your results is to register for My Chart.   IF you received an x-ray today, you will receive an invoice from [facility]. Please contact [facility] at [phone] with questions or concerns regarding your invoice.   IF you received labwork today, you will receive an invoice from [place]. Please contact [facility] at [phone] with  questions or concerns regarding your invoice.   Our billing staff will not be able to assist you with questions regarding bills from these companies.  You will be contacted with the lab results as soon as they are available. The fastest way to get your results is to activate your My Chart account. Instructions are located on the last page of this paperwork. If you have not heard from [place] regarding the results in 2 weeks, please contact this office.     Living With Anxiety  After being  diagnosed with an anxiety disorder, you may be relieved to know why you have felt or behaved a certain way. It is natural to also feel overwhelmed about the treatment ahead and what it will mean for your life. With care and support, you can manage this condition and recover from it. How to cope with anxiety Dealing with stress Stress is your body's reaction to life changes and events, both good and bad. Stress can last just a few hours or it can be ongoing. Stress can play a major role in anxiety, so it is important to learn both how to cope with stress and how to think about it differently. Talk with your health care provider or a counselor to learn more about stress reduction. He or she may suggest some stress reduction techniques, such as:  Music therapy. This can include creating or listening to music that you enjoy and that inspires you.  Mindfulness-based meditation. This involves being aware of your normal breaths, rather than trying to control your breathing. It can be done while sitting or walking.  Centering prayer. This is a kind of meditation that involves focusing on a word, phrase, or sacred image that is meaningful to you and that brings you peace.  Deep breathing. To do this, expand your stomach and inhale slowly through your nose. Hold your breath for 3-5 seconds. Then exhale slowly, allowing your stomach muscles to relax.  Self-talk. This is a skill where you identify thought patterns that lead to anxiety reactions and correct those thoughts.  Muscle relaxation. This involves tensing muscles then relaxing them. Choose a stress reduction technique that fits your lifestyle and personality. Stress reduction techniques take time and practice. Set aside 5-15 minutes a day to do them. Therapists can offer training in these techniques. The training may be covered by some insurance plans. Other things you can do to manage stress include:  Keeping a stress diary. This can help you learn  what triggers your stress and ways to control your response.  Thinking about how you respond to certain situations. You may not be able to control everything, but you can control your reaction.  Making time for activities that help you relax, and not feeling guilty about spending your time in this way. Therapy combined with coping and stress-reduction skills provides the best chance for successful treatment. Medicines Medicines can help ease symptoms. Medicines for anxiety include:  Anti-anxiety drugs.  Antidepressants.  Beta-blockers. Medicines may be used as the main treatment for anxiety disorder, along with therapy, or if other treatments are not working. Medicines should be prescribed by a health care provider. Relationships Relationships can play a big part in helping you recover. Try to spend more time connecting with trusted friends and family members. Consider going to couples counseling, taking family education classes, or going to family therapy. Therapy can help you and others better understand the condition. How to recognize changes in your condition Everyone has a different response  to treatment for anxiety. Recovery from anxiety happens when symptoms decrease and stop interfering with your daily activities at home or work. This may mean that you will start to:  Have better concentration and focus.  Sleep better.  Be less irritable.  Have more energy.  Have improved memory. It is important to recognize when your condition is getting worse. Contact your health care provider if your symptoms interfere with home or work and you do not feel like your condition is improving. Where to find help and support: You can get help and support from these sources:  Self-help groups.  Online and [employer].  A trusted spiritual leader.  Couples counseling.  Family education classes.  Family therapy. Follow these instructions at home:  Eat a healthy diet that  includes plenty of vegetables, fruits, whole grains, low-fat dairy products, and lean protein. Do not eat a lot of foods that are high in solid fats, added sugars, or salt.  Exercise. Most adults should do the following: ? Exercise for at least 150 minutes each week. The exercise should increase your heart rate and make you sweat (moderate-intensity exercise). ? Strengthening exercises at least twice a week.  Cut down on caffeine, tobacco, alcohol, and other potentially harmful substances.  Get the right amount and quality of sleep. Most adults need 7-9 hours of sleep each night.  Make choices that simplify your life.  Take over-the-counter and prescription medicines only as told by your health care provider.  Avoid caffeine, alcohol, and certain over-the-counter cold medicines. These may make you feel worse. Ask your pharmacist which medicines to avoid.  Keep all follow-up visits as told by your health care provider. This is important. Questions to ask your health care provider  Would I benefit from therapy?  How often should I follow up with a health care provider?  How long do I need to take medicine?  Are there any long-term side effects of my medicine?  Are there any alternatives to taking medicine? Contact a health care provider if:  You have a hard time staying focused or finishing daily tasks.  You spend many hours a day feeling worried about everyday life.  You become exhausted by worry.  You start to have headaches, feel tense, or have nausea.  You urinate more than normal.  You have diarrhea. Get help right away if:  You have a racing heart and shortness of breath.  You have thoughts of hurting yourself or others. If you ever feel like you may hurt yourself or others, or have thoughts about taking your own life, get help right away. You can go to your nearest emergency department or call:  Your local emergency services (911 in the [place]  A suicide crisis  helpline, such as the [facility] at 479-778-4526. This is open 24-hours a day. Summary  Taking steps to deal with stress can help calm you.  Medicines cannot cure anxiety disorders, but they can help ease symptoms.  Family, friends, and partners can play a big part in helping you recover from an anxiety disorder. This information is not intended to replace advice given to you by your health care provider. Make sure you discuss any questions you have with your health care provider. Document Released: 09/16/2016 Document Revised: 09/04/2017 Document Reviewed: 09/16/2016 Elsevier Patient Education  2020 Elsevier Inc.      Agustina Caroli, MD [facility]

## 2019-08-17 NOTE — Patient Instructions (Addendum)
   If you have lab work done today you will be contacted with your lab results within the next 2 weeks.  If you have not heard from us then please contact us. The fastest way to get your results is to register for My Chart.   IF you received an x-ray today, you will receive an invoice from [facility]. Please contact [facility] at [phone] with questions or concerns regarding your invoice.   IF you received labwork today, you will receive an invoice from [facility]. Please contact [facility] at [phone] with questions or concerns regarding your invoice.   Our billing staff will not be able to assist you with questions regarding bills from these companies.  You will be contacted with the lab results as soon as they are available. The fastest way to get your results is to activate your My Chart account. Instructions are located on the last page of this paperwork. If you have not heard from us regarding the results in 2 weeks, please contact this office.     Living With Anxiety  After being diagnosed with an anxiety disorder, you may be relieved to know why you have felt or behaved a certain way. It is natural to also feel overwhelmed about the treatment ahead and what it will mean for your life. With care and support, you can manage this condition and recover from it. How to cope with anxiety Dealing with stress Stress is your body's reaction to life changes and events, both good and bad. Stress can last just a few hours or it can be ongoing. Stress can play a major role in anxiety, so it is important to learn both how to cope with stress and how to think about it differently. Talk with your health care provider or a counselor to learn more about stress reduction. He or she may suggest some stress reduction techniques, such as:  Music therapy. This can include creating or listening to music that you enjoy and that inspires you.  Mindfulness-based meditation. This  involves being aware of your normal breaths, rather than trying to control your breathing. It can be done while sitting or walking.  Centering prayer. This is a kind of meditation that involves focusing on a word, phrase, or sacred image that is meaningful to you and that brings you peace.  Deep breathing. To do this, expand your stomach and inhale slowly through your nose. Hold your breath for 3-5 seconds. Then exhale slowly, allowing your stomach muscles to relax.  Self-talk. This is a skill where you identify thought patterns that lead to anxiety reactions and correct those thoughts.  Muscle relaxation. This involves tensing muscles then relaxing them. Choose a stress reduction technique that fits your lifestyle and personality. Stress reduction techniques take time and practice. Set aside 5-15 minutes a day to do them. Therapists can offer training in these techniques. The training may be covered by some insurance plans. Other things you can do to manage stress include:  Keeping a stress diary. This can help you learn what triggers your stress and ways to control your response.  Thinking about how you respond to certain situations. You may not be able to control everything, but you can control your reaction.  Making time for activities that help you relax, and not feeling guilty about spending your time in this way. Therapy combined with coping and stress-reduction skills provides the best chance for successful treatment. Medicines Medicines can help ease symptoms. Medicines for anxiety include:    Anti-anxiety drugs.  Antidepressants.  Beta-blockers. Medicines may be used as the main treatment for anxiety disorder, along with therapy, or if other treatments are not working. Medicines should be prescribed by a health care provider. Relationships Relationships can play a big part in helping you recover. Try to spend more time connecting with trusted friends and family members. Consider  going to couples counseling, taking family education classes, or going to family therapy. Therapy can help you and others better understand the condition. How to recognize changes in your condition Everyone has a different response to treatment for anxiety. Recovery from anxiety happens when symptoms decrease and stop interfering with your daily activities at home or work. This may mean that you will start to:  Have better concentration and focus.  Sleep better.  Be less irritable.  Have more energy.  Have improved memory. It is important to recognize when your condition is getting worse. Contact your health care provider if your symptoms interfere with home or work and you do not feel like your condition is improving. Where to find help and support: You can get help and support from these sources:  Self-help groups.  Online and community organizations.  A trusted spiritual leader.  Couples counseling.  Family education classes.  Family therapy. Follow these instructions at home:  Eat a healthy diet that includes plenty of vegetables, fruits, whole grains, low-fat dairy products, and lean protein. Do not eat a lot of foods that are high in solid fats, added sugars, or salt.  Exercise. Most adults should do the following: ? Exercise for at least 150 minutes each week. The exercise should increase your heart rate and make you sweat (moderate-intensity exercise). ? Strengthening exercises at least twice a week.  Cut down on caffeine, tobacco, alcohol, and other potentially harmful substances.  Get the right amount and quality of sleep. Most adults need 7-9 hours of sleep each night.  Make choices that simplify your life.  Take over-the-counter and prescription medicines only as told by your health care provider.  Avoid caffeine, alcohol, and certain over-the-counter cold medicines. These may make you feel worse. Ask your pharmacist which medicines to avoid.  Keep all  follow-up visits as told by your health care provider. This is important. Questions to ask your health care provider  Would I benefit from therapy?  How often should I follow up with a health care provider?  How long do I need to take medicine?  Are there any long-term side effects of my medicine?  Are there any alternatives to taking medicine? Contact a health care provider if:  You have a hard time staying focused or finishing daily tasks.  You spend many hours a day feeling worried about everyday life.  You become exhausted by worry.  You start to have headaches, feel tense, or have nausea.  You urinate more than normal.  You have diarrhea. Get help right away if:  You have a racing heart and shortness of breath.  You have thoughts of hurting yourself or others. If you ever feel like you may hurt yourself or others, or have thoughts about taking your own life, get help right away. You can go to your nearest emergency department or call:  Your local emergency services (911 in the [place]  A suicide crisis helpline, such as the National Suicide Prevention Lifeline at 1-800-273-8255. This is open 24-hours a day. Summary  Taking steps to deal with stress can help calm you.  Medicines cannot cure anxiety disorders,   but they can help ease symptoms.  Family, friends, and partners can play a big part in helping you recover from an anxiety disorder. This information is not intended to replace advice given to you by your health care provider. Make sure you discuss any questions you have with your health care provider. Document Released: 09/16/2016 Document Revised: 09/04/2017 Document Reviewed: 09/16/2016 Elsevier Patient Education  2020 Elsevier Inc.  

## 2019-08-28 ENCOUNTER — Other Ambulatory Visit: Payer: Self-pay | Admitting: Emergency Medicine

## 2019-08-28 DIAGNOSIS — G8929 Other chronic pain: Secondary | ICD-10-CM

## 2019-09-13 MED ORDER — IBUPROFEN 800 MG PO TABS: 800.0000 mg | ORAL_TABLET | Freq: Four times a day (QID) | ORAL | 0 refills | Status: DC | PRN

## 2019-09-17 ENCOUNTER — Other Ambulatory Visit: Payer: Self-pay | Admitting: Emergency Medicine

## 2019-09-17 DIAGNOSIS — G8929 Other chronic pain: Secondary | ICD-10-CM

## 2019-09-17 DIAGNOSIS — M545 Low back pain: Secondary | ICD-10-CM

## 2019-09-20 MED ORDER — CYCLOBENZAPRINE HCL 5 MG PO TABS: 5.0000 mg | ORAL_TABLET | Freq: Every day | ORAL | 0 refills | Status: DC | PRN

## 2019-10-03 ENCOUNTER — Other Ambulatory Visit: Payer: Self-pay

## 2019-10-03 ENCOUNTER — Encounter: Payer: Self-pay | Admitting: Emergency Medicine

## 2019-10-03 ENCOUNTER — Ambulatory Visit (INDEPENDENT_AMBULATORY_CARE_PROVIDER_SITE_OTHER): Payer: BC Managed Care – PPO | Admitting: Emergency Medicine

## 2019-10-03 ENCOUNTER — Other Ambulatory Visit (HOSPITAL_COMMUNITY)
Admission: RE | Admit: 2019-10-03 | Discharge: 2019-10-03 | Disposition: A | Discharge status: Home / Self Care | Payer: BC Managed Care – PPO | Source: Ambulatory Visit | Attending: Emergency Medicine | Admitting: Emergency Medicine

## 2019-10-03 VITALS — BP 118/74 | HR 67 | Temp 98.2°F | Resp 16 | Ht 62.75 in | Wt 221.0 lb

## 2019-10-03 DIAGNOSIS — Z113 Encounter for screening for infections with a predominantly sexual mode of transmission: Secondary | ICD-10-CM | POA: Insufficient documentation

## 2019-10-03 DIAGNOSIS — R4582 Worries: Secondary | ICD-10-CM | POA: Diagnosis not present

## 2019-10-03 DIAGNOSIS — I10 Essential (primary) hypertension: Secondary | ICD-10-CM

## 2019-10-03 DIAGNOSIS — F419 Anxiety disorder, unspecified: Secondary | ICD-10-CM | POA: Diagnosis not present

## 2019-10-03 MED ORDER — BUSPIRONE HCL 7.5 MG PO TABS: 7.5000 mg | ORAL_TABLET | Freq: Two times a day (BID) | ORAL | 1 refills | Status: AC

## 2019-10-03 NOTE — Patient Instructions (Addendum)
   If you have lab work done today you will be contacted with your lab results within the next 2 weeks.  If you have not heard from us then please contact us. The fastest way to get your results is to register for My Chart.   IF you received an x-ray today, you will receive an invoice from [facility]. Please contact [facility] at [phone] with questions or concerns regarding your invoice.   IF you received labwork today, you will receive an invoice from [facility]. Please contact [facility] at [phone] with questions or concerns regarding your invoice.   Our billing staff will not be able to assist you with questions regarding bills from these companies.  You will be contacted with the lab results as soon as they are available. The fastest way to get your results is to activate your My Chart account. Instructions are located on the last page of this paperwork. If you have not heard from us regarding the results in 2 weeks, please contact this office.      Health Maintenance, Female Adopting a healthy lifestyle and getting preventive care are important in promoting health and wellness. Ask your health care provider about:  The right schedule for you to have regular tests and exams.  Things you can do on your own to prevent diseases and keep yourself healthy. What should I know about diet, weight, and exercise? Eat a healthy diet   Eat a diet that includes plenty of vegetables, fruits, low-fat dairy products, and lean protein.  Do not eat a lot of foods that are high in solid fats, added sugars, or sodium. Maintain a healthy weight Body mass index (BMI) is used to identify weight problems. It estimates body fat based on height and weight. Your health care provider can help determine your BMI and help you achieve or maintain a healthy weight. Get regular exercise Get regular exercise. This is one of the most important things you can do for your health. Most  adults should:  Exercise for at least 150 minutes each week. The exercise should increase your heart rate and make you sweat (moderate-intensity exercise).  Do strengthening exercises at least twice a week. This is in addition to the moderate-intensity exercise.  Spend less time sitting. Even light physical activity can be beneficial. Watch cholesterol and blood lipids Have your blood tested for lipids and cholesterol at 33 years of age, then have this test every 5 years. Have your cholesterol levels checked more often if:  Your lipid or cholesterol levels are high.  You are older than 33 years of age.  You are at high risk for heart disease. What should I know about cancer screening? Depending on your health history and family history, you may need to have cancer screening at various ages. This may include screening for:  Breast cancer.  Cervical cancer.  Colorectal cancer.  Skin cancer.  Lung cancer. What should I know about heart disease, diabetes, and high blood pressure? Blood pressure and heart disease  High blood pressure causes heart disease and increases the risk of stroke. This is more likely to develop in people who have high blood pressure readings, are of African descent, or are overweight.  Have your blood pressure checked: ? Every 3-5 years if you are 18-39 years of age. ? Every year if you are 40 years old or older. Diabetes Have regular diabetes screenings. This checks your fasting blood sugar level. Have the screening done:  Once every   three years after age 40 if you are at a normal weight and have a low risk for diabetes.  More often and at a younger age if you are overweight or have a high risk for diabetes. What should I know about preventing infection? Hepatitis B If you have a higher risk for hepatitis B, you should be screened for this virus. Talk with your health care provider to find out if you are at risk for hepatitis B infection. Hepatitis  C Testing is recommended for:  Everyone born from 1945 through 1965.  Anyone with known risk factors for hepatitis C. Sexually transmitted infections (STIs)  Get screened for STIs, including gonorrhea and chlamydia, if: ? You are sexually active and are younger than 33 years of age. ? You are older than 33 years of age and your health care provider tells you that you are at risk for this type of infection. ? Your sexual activity has changed since you were last screened, and you are at increased risk for chlamydia or gonorrhea. Ask your health care provider if you are at risk.  Ask your health care provider about whether you are at high risk for HIV. Your health care provider may recommend a prescription medicine to help prevent HIV infection. If you choose to take medicine to prevent HIV, you should first get tested for HIV. You should then be tested every 3 months for as long as you are taking the medicine. Pregnancy  If you are about to stop having your period (premenopausal) and you may become pregnant, seek counseling before you get pregnant.  Take 400 to 800 micrograms (mcg) of folic acid every day if you become pregnant.  Ask for birth control (contraception) if you want to prevent pregnancy. Osteoporosis and menopause Osteoporosis is a disease in which the bones lose minerals and strength with aging. This can result in bone fractures. If you are 65 years old or older, or if you are at risk for osteoporosis and fractures, ask your health care provider if you should:  Be screened for bone loss.  Take a calcium or vitamin D supplement to lower your risk of fractures.  Be given hormone replacement therapy (HRT) to treat symptoms of menopause. Follow these instructions at home: Lifestyle  Do not use any products that contain nicotine or tobacco, such as cigarettes, e-cigarettes, and chewing tobacco. If you need help quitting, ask your health care provider.  Do not use street  drugs.  Do not share needles.  Ask your health care provider for help if you need support or information about quitting drugs. Alcohol use  Do not drink alcohol if: ? Your health care provider tells you not to drink. ? You are pregnant, may be pregnant, or are planning to become pregnant.  If you drink alcohol: ? Limit how much you use to 0-1 drink a day. ? Limit intake if you are breastfeeding.  Be aware of how much alcohol is in your drink. In the [place] one drink equals one 12 oz bottle of beer (355 mL), one 5 oz glass of wine (148 mL), or one 1 oz glass of hard liquor (44 mL). General instructions  Schedule regular health, dental, and eye exams.  Stay current with your vaccines.  Tell your health care provider if: ? You often feel depressed. ? You have ever been abused or do not feel safe at home. Summary  Adopting a healthy lifestyle and getting preventive care are important in promoting health and wellness.    Follow your health care provider's instructions about healthy diet, exercising, and getting tested or screened for diseases.  Follow your health care provider's instructions on monitoring your cholesterol and blood pressure. This information is not intended to replace advice given to you by your health care provider. Make sure you discuss any questions you have with your health care provider. Document Released: 04/07/2011 Document Revised: 09/15/2018 Document Reviewed: 09/15/2018 Elsevier Patient Education  2020 [employer].  Hypertension, Adult High blood pressure (hypertension) is when the force of blood pumping through the arteries is too strong. The arteries are the blood vessels that carry blood from the heart throughout the body. Hypertension forces the heart to work harder to pump blood and may cause arteries to become narrow or stiff. Untreated or uncontrolled hypertension can cause a heart attack, heart failure, a stroke, kidney disease, and other  problems. A blood pressure reading consists of a higher number over a lower number. Ideally, your blood pressure should be below 120/80. The first ("top") number is called the systolic pressure. It is a measure of the pressure in your arteries as your heart beats. The second ("bottom") number is called the diastolic pressure. It is a measure of the pressure in your arteries as the heart relaxes. What are the causes? The exact cause of this condition is not known. There are some conditions that result in or are related to high blood pressure. What increases the risk? Some risk factors for high blood pressure are under your control. The following factors may make you more likely to develop this condition:  Smoking.  Having type 2 diabetes mellitus, high cholesterol, or both.  Not getting enough exercise or physical activity.  Being overweight.  Having too much fat, sugar, calories, or salt (sodium) in your diet.  Drinking too much alcohol. Some risk factors for high blood pressure may be difficult or impossible to change. Some of these factors include:  Having chronic kidney disease.  Having a family history of high blood pressure.  Age. Risk increases with age.  Race. You may be at higher risk if you are African American.  Gender. Men are at higher risk than women before age 60. After age 33, women are at higher risk than men.  Having obstructive sleep apnea.  Stress. What are the signs or symptoms? High blood pressure may not cause symptoms. Very high blood pressure (hypertensive crisis) may cause:  Headache.  Anxiety.  Shortness of breath.  Nosebleed.  Nausea and vomiting.  Vision changes.  Severe chest pain.  Seizures. How is this diagnosed? This condition is diagnosed by measuring your blood pressure while you are seated, with your arm resting on a flat surface, your legs uncrossed, and your feet flat on the floor. The cuff of the blood pressure monitor will be  placed directly against the skin of your upper arm at the level of your heart. It should be measured at least twice using the same arm. Certain conditions can cause a difference in blood pressure between your right and left arms. Certain factors can cause blood pressure readings to be lower or higher than normal for a short period of time:  When your blood pressure is higher when you are in a health care provider's office than when you are at home, this is called white coat hypertension. Most people with this condition do not need medicines.  When your blood pressure is higher at home than when you are in a health care provider's office, this is  called masked hypertension. Most people with this condition may need medicines to control blood pressure. If you have a high blood pressure reading during one visit or you have normal blood pressure with other risk factors, you may be asked to:  Return on a different day to have your blood pressure checked again.  Monitor your blood pressure at home for 1 week or longer. If you are diagnosed with hypertension, you may have other blood or imaging tests to help your health care provider understand your overall risk for other conditions. How is this treated? This condition is treated by making healthy lifestyle changes, such as eating healthy foods, exercising more, and reducing your alcohol intake. Your health care provider may prescribe medicine if lifestyle changes are not enough to get your blood pressure under control, and if:  Your systolic blood pressure is above 130.  Your diastolic blood pressure is above 80. Your personal target blood pressure may vary depending on your medical conditions, your age, and other factors. Follow these instructions at home: Eating and drinking   Eat a diet that is high in fiber and potassium, and low in sodium, added sugar, and fat. An example eating plan is called the DASH (Dietary Approaches to Stop Hypertension)  diet. To eat this way: ? Eat plenty of fresh fruits and vegetables. Try to fill one half of your plate at each meal with fruits and vegetables. ? Eat whole grains, such as whole-wheat pasta, brown rice, or whole-grain bread. Fill about one fourth of your plate with whole grains. ? Eat or drink low-fat dairy products, such as skim milk or low-fat yogurt. ? Avoid fatty cuts of meat, processed or cured meats, and poultry with skin. Fill about one fourth of your plate with lean proteins, such as fish, chicken without skin, beans, eggs, or tofu. ? Avoid pre-made and processed foods. These tend to be higher in sodium, added sugar, and fat.  Reduce your daily sodium intake. Most people with hypertension should eat less than 1,500 mg of sodium a day.  Do not drink alcohol if: ? Your health care provider tells you not to drink. ? You are pregnant, may be pregnant, or are planning to become pregnant.  If you drink alcohol: ? Limit how much you use to:  0-1 drink a day for women.  0-2 drinks a day for men. ? Be aware of how much alcohol is in your drink. In the [place] one drink equals one 12 oz bottle of beer (355 mL), one 5 oz glass of wine (148 mL), or one 1 oz glass of hard liquor (44 mL). Lifestyle   Work with your health care provider to maintain a healthy body weight or to lose weight. Ask what an ideal weight is for you.  Get at least 30 minutes of exercise most days of the week. Activities may include walking, swimming, or biking.  Include exercise to strengthen your muscles (resistance exercise), such as Pilates or lifting weights, as part of your weekly exercise routine. Try to do these types of exercises for 30 minutes at least 3 days a week.  Do not use any products that contain nicotine or tobacco, such as cigarettes, e-cigarettes, and chewing tobacco. If you need help quitting, ask your health care provider.  Monitor your blood pressure at home as told by your health care  provider.  Keep all follow-up visits as told by your health care provider. This is important. Medicines  Take over-the-counter and prescription medicines only  as told by your health care provider. Follow directions carefully. Blood pressure medicines must be taken as prescribed.  Do not skip doses of blood pressure medicine. Doing this puts you at risk for problems and can make the medicine less effective.  Ask your health care provider about side effects or reactions to medicines that you should watch for. Contact a health care provider if you:  Think you are having a reaction to a medicine you are taking.  Have headaches that keep coming back (recurring).  Feel dizzy.  Have swelling in your ankles.  Have trouble with your vision. Get help right away if you:  Develop a severe headache or confusion.  Have unusual weakness or numbness.  Feel faint.  Have severe pain in your chest or abdomen.  Vomit repeatedly.  Have trouble breathing. Summary  Hypertension is when the force of blood pumping through your arteries is too strong. If this condition is not controlled, it may put you at risk for serious complications.  Your personal target blood pressure may vary depending on your medical conditions, your age, and other factors. For most people, a normal blood pressure is less than 120/80.  Hypertension is treated with lifestyle changes, medicines, or a combination of both. Lifestyle changes include losing weight, eating a healthy, low-sodium diet, exercising more, and limiting alcohol. This information is not intended to replace advice given to you by your health care provider. Make sure you discuss any questions you have with your health care provider. Document Released: 09/22/2005 Document Revised: 06/02/2018 Document Reviewed: 06/02/2018 Elsevier Patient Education  2020 [employer].

## 2019-10-03 NOTE — Progress Notes (Signed)
BP Readings from Last 3 Encounters:  10/03/19 118/74  08/17/19 (!) 143/96  07/07/19 102/67   Alexis Donaldson 33 y.o.   Chief Complaint  Patient presents with  . Hypertension    6 month follow up  . std screening    per patient no exposure    HISTORY OF PRESENT ILLNESS: This is a 33 y.o. female here for follow-up of hypertension. 1.  Hypertension: On amlodipine 5 mg daily and lisinopril 10 mg daily.  Doing well.  No complaints. 2.  Chronic anxiety: On BuSpar 7.5 mg twice a day.  Referred to psychiatrist but no scheduled appointment yet. 3.  Worries about STDs.  Requesting screening. No other complaints or medical concerns today.  HPI   Prior to Admission medications   Medication Sig Start Date End Date Taking? Authorizing Provider  albuterol (PROVENTIL HFA;VENTOLIN HFA) 108 (90 Base) MCG/ACT inhaler Inhale 2 puffs into the lungs every 4 (four) hours as needed for wheezing or shortness of breath (cough, shortness of breath or wheezing.). 01/14/17  Yes Timmothy Euler, [place] D, PA-C  amLODipine (NORVASC) 5 MG tablet Take 1 tablet (5 mg total) by mouth daily. 01/31/19  Yes Sharay Bellissimo, Ines Bloomer, MD  beclomethasone (QVAR) 40 MCG/ACT inhaler Inhale 1 puff into the lungs 2 (two) times daily. 01/03/19  Yes SagardiaInes Bloomer, MD  Blood Pressure Monitoring (BLOOD PRESSURE KIT) DEVI 1 Units by Does not apply route daily. 12/12/16  Yes Timmothy Euler, [place] D, PA-C  cetirizine (ZYRTEC) 10 MG tablet Take 1 tablet (10 mg total) by mouth daily. 01/03/19  Yes Aziel Morgan, Ines Bloomer, MD  cyclobenzaprine (FLEXERIL) 5 MG tablet Take 1 tablet (5 mg total) by mouth daily as needed for muscle spasms. 09/20/19  Yes Akiel Fennell, Ines Bloomer, MD  fluticasone [facility]) 50 MCG/ACT nasal spray Place 2 sprays into both nostrils daily. 01/03/19  Yes Beyza Bellino, Ines Bloomer, MD  hydrOXYzine (ATARAX/VISTARIL) 25 MG tablet Take 1-2 tablets (25-50 mg total) by mouth 3 (three) times daily as needed. 01/03/19  Yes Kelia Gibbon, Ines Bloomer,  MD  ibuprofen (ADVIL) 800 MG tablet Take 1 tablet (800 mg total) by mouth every 6 (six) hours as needed (as needed for pain). 09/13/19  Yes Froilan Mclean, Ines Bloomer, MD  levonorgestrel [facility]) 20 MCG/24HR IUD by Intrauterine route.   Yes [provider]  lisinopril (ZESTRIL) 10 MG tablet Take 1 tablet (10 mg total) by mouth daily. 02/09/19  Yes Teighan Aubert, Ines Bloomer, MD  montelukast (SINGULAIR) 10 MG tablet Take 1 tablet (10 mg total) by mouth at bedtime. 01/31/19  Yes Horald Pollen, MD    Allergies  Allergen Reactions  . Effexor [Venlafaxine] Anxiety  . Zoloft [Sertraline Hcl] Anxiety and Other (See Comments)    Patient Active Problem List   Diagnosis Date Noted  . Chronic bilateral low back pain without sciatica 01/03/2019  . Chronic anxiety 01/03/2019  . Essential hypertension 01/03/2019    Past Medical History:  Diagnosis Date  . Allergy   . Anxiety   . Asthma   . Cervical dysplasia   . Depression   . HSV-1 (herpes simplex virus 1) infection   . HSV-2 (herpes simplex virus 2) infection   . Hypertension   . STD (sexually transmitted disease)    Chlamydia X2,HSV    Past Surgical History:  Procedure Laterality Date  . CERVICAL BIOPSY    . COLPOSCOPY    . GYNECOLOGIC CRYOSURGERY    . Mirena     Inserted 04/07/2018  . WISDOM TOOTH EXTRACTION  Social History   Socioeconomic History  . Marital status: Single    Spouse name: Not on file  . Number of children: 0  . Years of education: Not on file  . Highest education level: Not on file  Occupational History  . Not on file  Tobacco Use  . Smoking status: Former Smoker    Packs/day: 0.25    Years: 4.00    Pack years: 1.00    Types: Cigarettes  . Smokeless tobacco: Never Used  Substance and Sexual Activity  . Alcohol use: Yes    Alcohol/week: 8.0 standard drinks    Types: 4 Glasses of wine, 1 Cans of beer, 3 Shots of liquor per week  . Drug use: Yes    Types: Marijuana    Comment: Occas  .  Sexual activity: Yes    Birth control/protection: I.U.D.    Comment: Mirena inserted 04/07/2018-1st intercourse 33 yo-More than 5 partners  Other Topics Concern  . Not on file  Social History Narrative   Program supervisor   Social Determinants of Health   Financial Resource Strain:   . Difficulty of Paying Living Expenses: Not on file  Food Insecurity:   . Worried About Charity fundraiser in the Last Year: Not on file  . Ran Out of Food in the Last Year: Not on file  Transportation Needs:   . Lack of Transportation (Medical): Not on file  . Lack of Transportation (Non-Medical): Not on file  Physical Activity:   . Days of Exercise per Week: Not on file  . Minutes of Exercise per Session: Not on file  Stress:   . Feeling of Stress : Not on file  Social Connections:   . Frequency of Communication with Friends and Family: Not on file  . Frequency of Social Gatherings with Friends and Family: Not on file  . Attends Religious Services: Not on file  . Active Member of Clubs or Organizations: Not on file  . Attends Archivist Meetings: Not on file  . Marital Status: Not on file  Intimate Partner Violence:   . Fear of Current or Ex-Partner: Not on file  . Emotionally Abused: Not on file  . Physically Abused: Not on file  . Sexually Abused: Not on file    Family History  Problem Relation Age of Onset  . Hyperlipidemia Mother   . Hypertension Mother   . Hypertension Father   . Heart attack Father   . Mental illness Sister   . Diabetes Maternal Grandmother   . Cancer Maternal Grandmother        Lung  . Cancer Maternal Grandfather        Lung  . Hypertension Maternal Grandfather   . Heart disease Maternal Grandfather   . Mental illness Paternal Grandmother   . Hypertension Paternal Grandfather   . Stroke Paternal Grandfather   . Mental illness Sister      Review of Systems  Constitutional: Negative.  Negative for chills and fever.  HENT: Negative.  Negative for  congestion and sore throat.   Respiratory: Negative.  Negative for cough and shortness of breath.   Cardiovascular: Negative.  Negative for chest pain and palpitations.  Gastrointestinal: Negative.  Negative for abdominal pain, diarrhea, nausea and vomiting.  Musculoskeletal: Negative.  Negative for myalgias and neck pain.  Skin: Negative.  Negative for rash.  Neurological: Negative.  Negative for dizziness and headaches.  Endo/Heme/Allergies: Does not bruise/bleed easily.  All other systems reviewed and are negative.  Today's Vitals   10/03/19 1026  BP: 118/74  Pulse: 67  Resp: 16  Temp: 98.2 F (36.8 C)  TempSrc: Oral  SpO2: 99%  Weight: 221 lb (100.2 kg)  Height: 5' 2.75" (1.594 m)   Body mass index is 39.46 kg/m.   Physical Exam Vitals reviewed.  Constitutional:      Appearance: Normal appearance. She is obese.  HENT:     Head: Normocephalic.  Eyes:     Extraocular Movements: Extraocular movements intact.     Pupils: Pupils are equal, round, and reactive to light.  Cardiovascular:     Rate and Rhythm: Normal rate and regular rhythm.     Pulses: Normal pulses.     Heart sounds: Normal heart sounds.  Pulmonary:     Effort: Pulmonary effort is normal.     Breath sounds: Normal breath sounds.  Abdominal:     Palpations: Abdomen is soft.     Tenderness: There is no abdominal tenderness.  Musculoskeletal:        General: Normal range of motion.     Cervical back: Normal range of motion and neck supple.  Skin:    General: Skin is warm and dry.     Capillary Refill: Capillary refill takes less than 2 seconds.  Neurological:     General: No focal deficit present.     Mental Status: She is alert and oriented to person, place, and time.  Psychiatric:        Mood and Affect: Mood normal.        Behavior: Behavior normal.    A total of 25 minutes was spent in the room with the patient, greater than 50% of which was in counseling/coordination of care regarding  hypertension and chronic management including medications, diet, and nutrition.  Also STD concerns addressed as well as chronic anxiety, medication, and need to schedule a psychiatric evaluation.   ASSESSMENT & PLAN: Aeva was seen today for hypertension and std screening.  Diagnoses and all orders for this visit:  Essential hypertension  Screening examination for STD (sexually transmitted disease) -     STD Panel -     GC/Chlamydia probe amp ([place])not at [facility]  Chronic anxiety -     busPIRone (BUSPAR) 7.5 MG tablet; Take 1 tablet (7.5 mg total) by mouth 2 (two) times daily.    Patient Instructions       If you have lab work done today you will be contacted with your lab results within the next 2 weeks.  If you have not heard from [place] then please contact us. The fastest way to get your results is to register for My Chart.   IF you received an x-ray today, you will receive an invoice from [facility]. Please contact [facility] at [phone] with questions or concerns regarding your invoice.   IF you received labwork today, you will receive an invoice from [place]. Please contact [facility] at [phone] with questions or concerns regarding your invoice.   Our billing staff will not be able to assist you with questions regarding bills from these companies.  You will be contacted with the lab results as soon as they are available. The fastest way to get your results is to activate your My Chart account. Instructions are located on the last page of this paperwork. If you have not heard from [place] regarding the results in 2 weeks, please contact this office.      Health Maintenance, Female Adopting a healthy lifestyle  and getting preventive care are important in promoting health and wellness. Ask your health care provider about:  The right schedule for you to have regular tests and exams.  Things you can do on your own to prevent diseases and keep  yourself healthy. What should I know about diet, weight, and exercise? Eat a healthy diet   Eat a diet that includes plenty of vegetables, fruits, low-fat dairy products, and lean protein.  Do not eat a lot of foods that are high in solid fats, added sugars, or sodium. Maintain a healthy weight Body mass index (BMI) is used to identify weight problems. It estimates body fat based on height and weight. Your health care provider can help determine your BMI and help you achieve or maintain a healthy weight. Get regular exercise Get regular exercise. This is one of the most important things you can do for your health. Most adults should:  Exercise for at least 150 minutes each week. The exercise should increase your heart rate and make you sweat (moderate-intensity exercise).  Do strengthening exercises at least twice a week. This is in addition to the moderate-intensity exercise.  Spend less time sitting. Even light physical activity can be beneficial. Watch cholesterol and blood lipids Have your blood tested for lipids and cholesterol at 33 years of age, then have this test every 5 years. Have your cholesterol levels checked more often if:  Your lipid or cholesterol levels are high.  You are older than 33 years of age.  You are at high risk for heart disease. What should I know about cancer screening? Depending on your health history and family history, you may need to have cancer screening at various ages. This may include screening for:  Breast cancer.  Cervical cancer.  Colorectal cancer.  Skin cancer.  Lung cancer. What should I know about heart disease, diabetes, and high blood pressure? Blood pressure and heart disease  High blood pressure causes heart disease and increases the risk of stroke. This is more likely to develop in people who have high blood pressure readings, are of African descent, or are overweight.  Have your blood pressure checked: ? Every 3-5 years  if you are 32-48 years of age. ? Every year if you are 64 years old or older. Diabetes Have regular diabetes screenings. This checks your fasting blood sugar level. Have the screening done:  Once every three years after age 58 if you are at a normal weight and have a low risk for diabetes.  More often and at a younger age if you are overweight or have a high risk for diabetes. What should I know about preventing infection? Hepatitis B If you have a higher risk for hepatitis B, you should be screened for this virus. Talk with your health care provider to find out if you are at risk for hepatitis B infection. Hepatitis C Testing is recommended for:  Everyone born from [birth year] through 1965.  Anyone with known risk factors for hepatitis C. Sexually transmitted infections (STIs)  Get screened for STIs, including gonorrhea and chlamydia, if: ? You are sexually active and are younger than 33 years of age. ? You are older than 33 years of age and your health care provider tells you that you are at risk for this type of infection. ? Your sexual activity has changed since you were last screened, and you are at increased risk for chlamydia or gonorrhea. Ask your health care provider if you are at risk.  Ask  your health care provider about whether you are at high risk for HIV. Your health care provider may recommend a prescription medicine to help prevent HIV infection. If you choose to take medicine to prevent HIV, you should first get tested for HIV. You should then be tested every 3 months for as long as you are taking the medicine. Pregnancy  If you are about to stop having your period (premenopausal) and you may become pregnant, seek counseling before you get pregnant.  Take 400 to 800 micrograms (mcg) of folic acid every day if you become pregnant.  Ask for birth control (contraception) if you want to prevent pregnancy. Osteoporosis and menopause Osteoporosis is a disease in which the bones  lose minerals and strength with aging. This can result in bone fractures. If you are 108 years old or older, or if you are at risk for osteoporosis and fractures, ask your health care provider if you should:  Be screened for bone loss.  Take a calcium or vitamin D supplement to lower your risk of fractures.  Be given hormone replacement therapy (HRT) to treat symptoms of menopause. Follow these instructions at home: Lifestyle  Do not use any products that contain nicotine or tobacco, such as cigarettes, e-cigarettes, and chewing tobacco. If you need help quitting, ask your health care provider.  Do not use street drugs.  Do not share needles.  Ask your health care provider for help if you need support or information about quitting drugs. Alcohol use  Do not drink alcohol if: ? Your health care provider tells you not to drink. ? You are pregnant, may be pregnant, or are planning to become pregnant.  If you drink alcohol: ? Limit how much you use to 0-1 drink a day. ? Limit intake if you are breastfeeding.  Be aware of how much alcohol is in your drink. In the [place] one drink equals one 12 oz bottle of beer (355 mL), one 5 oz glass of wine (148 mL), or one 1 oz glass of hard liquor (44 mL). General instructions  Schedule regular health, dental, and eye exams.  Stay current with your vaccines.  Tell your health care provider if: ? You often feel depressed. ? You have ever been abused or do not feel safe at home. Summary  Adopting a healthy lifestyle and getting preventive care are important in promoting health and wellness.  Follow your health care provider's instructions about healthy diet, exercising, and getting tested or screened for diseases.  Follow your health care provider's instructions on monitoring your cholesterol and blood pressure. This information is not intended to replace advice given to you by your health care provider. Make sure you discuss any questions  you have with your health care provider. Document Released: 04/07/2011 Document Revised: 09/15/2018 Document Reviewed: 09/15/2018 Elsevier Patient Education  2020 [employer].  Hypertension, Adult High blood pressure (hypertension) is when the force of blood pumping through the arteries is too strong. The arteries are the blood vessels that carry blood from the heart throughout the body. Hypertension forces the heart to work harder to pump blood and may cause arteries to become narrow or stiff. Untreated or uncontrolled hypertension can cause a heart attack, heart failure, a stroke, kidney disease, and other problems. A blood pressure reading consists of a higher number over a lower number. Ideally, your blood pressure should be below 120/80. The first ("top") number is called the systolic pressure. It is a measure of the pressure in your arteries  as your heart beats. The second ("bottom") number is called the diastolic pressure. It is a measure of the pressure in your arteries as the heart relaxes. What are the causes? The exact cause of this condition is not known. There are some conditions that result in or are related to high blood pressure. What increases the risk? Some risk factors for high blood pressure are under your control. The following factors may make you more likely to develop this condition:  Smoking.  Having type 2 diabetes mellitus, high cholesterol, or both.  Not getting enough exercise or physical activity.  Being overweight.  Having too much fat, sugar, calories, or salt (sodium) in your diet.  Drinking too much alcohol. Some risk factors for high blood pressure may be difficult or impossible to change. Some of these factors include:  Having chronic kidney disease.  Having a family history of high blood pressure.  Age. Risk increases with age.  Race. You may be at higher risk if you are African American.  Gender. Men are at higher risk than women before age 67.  After age 49, women are at higher risk than men.  Having obstructive sleep apnea.  Stress. What are the signs or symptoms? High blood pressure may not cause symptoms. Very high blood pressure (hypertensive crisis) may cause:  Headache.  Anxiety.  Shortness of breath.  Nosebleed.  Nausea and vomiting.  Vision changes.  Severe chest pain.  Seizures. How is this diagnosed? This condition is diagnosed by measuring your blood pressure while you are seated, with your arm resting on a flat surface, your legs uncrossed, and your feet flat on the floor. The cuff of the blood pressure monitor will be placed directly against the skin of your upper arm at the level of your heart. It should be measured at least twice using the same arm. Certain conditions can cause a difference in blood pressure between your right and left arms. Certain factors can cause blood pressure readings to be lower or higher than normal for a short period of time:  When your blood pressure is higher when you are in a health care provider's office than when you are at home, this is called white coat hypertension. Most people with this condition do not need medicines.  When your blood pressure is higher at home than when you are in a health care provider's office, this is called masked hypertension. Most people with this condition may need medicines to control blood pressure. If you have a high blood pressure reading during one visit or you have normal blood pressure with other risk factors, you may be asked to:  Return on a different day to have your blood pressure checked again.  Monitor your blood pressure at home for 1 week or longer. If you are diagnosed with hypertension, you may have other blood or imaging tests to help your health care provider understand your overall risk for other conditions. How is this treated? This condition is treated by making healthy lifestyle changes, such as eating healthy foods,  exercising more, and reducing your alcohol intake. Your health care provider may prescribe medicine if lifestyle changes are not enough to get your blood pressure under control, and if:  Your systolic blood pressure is above 130.  Your diastolic blood pressure is above 80. Your personal target blood pressure may vary depending on your medical conditions, your age, and other factors. Follow these instructions at home: Eating and drinking   Eat a diet that is  high in fiber and potassium, and low in sodium, added sugar, and fat. An example eating plan is called the DASH (Dietary Approaches to Stop Hypertension) diet. To eat this way: ? Eat plenty of fresh fruits and vegetables. Try to fill one half of your plate at each meal with fruits and vegetables. ? Eat whole grains, such as whole-wheat pasta, brown rice, or whole-grain bread. Fill about one fourth of your plate with whole grains. ? Eat or drink low-fat dairy products, such as skim milk or low-fat yogurt. ? Avoid fatty cuts of meat, processed or cured meats, and poultry with skin. Fill about one fourth of your plate with lean proteins, such as fish, chicken without skin, beans, eggs, or tofu. ? Avoid pre-made and processed foods. These tend to be higher in sodium, added sugar, and fat.  Reduce your daily sodium intake. Most people with hypertension should eat less than 1,500 mg of sodium a day.  Do not drink alcohol if: ? Your health care provider tells you not to drink. ? You are pregnant, may be pregnant, or are planning to become pregnant.  If you drink alcohol: ? Limit how much you use to:  0-1 drink a day for women.  0-2 drinks a day for men. ? Be aware of how much alcohol is in your drink. In the [place] one drink equals one 12 oz bottle of beer (355 mL), one 5 oz glass of wine (148 mL), or one 1 oz glass of hard liquor (44 mL). Lifestyle   Work with your health care provider to maintain a healthy body weight or to lose  weight. Ask what an ideal weight is for you.  Get at least 30 minutes of exercise most days of the week. Activities may include walking, swimming, or biking.  Include exercise to strengthen your muscles (resistance exercise), such as Pilates or lifting weights, as part of your weekly exercise routine. Try to do these types of exercises for 30 minutes at least 3 days a week.  Do not use any products that contain nicotine or tobacco, such as cigarettes, e-cigarettes, and chewing tobacco. If you need help quitting, ask your health care provider.  Monitor your blood pressure at home as told by your health care provider.  Keep all follow-up visits as told by your health care provider. This is important. Medicines  Take over-the-counter and prescription medicines only as told by your health care provider. Follow directions carefully. Blood pressure medicines must be taken as prescribed.  Do not skip doses of blood pressure medicine. Doing this puts you at risk for problems and can make the medicine less effective.  Ask your health care provider about side effects or reactions to medicines that you should watch for. Contact a health care provider if you:  Think you are having a reaction to a medicine you are taking.  Have headaches that keep coming back (recurring).  Feel dizzy.  Have swelling in your ankles.  Have trouble with your vision. Get help right away if you:  Develop a severe headache or confusion.  Have unusual weakness or numbness.  Feel faint.  Have severe pain in your chest or abdomen.  Vomit repeatedly.  Have trouble breathing. Summary  Hypertension is when the force of blood pumping through your arteries is too strong. If this condition is not controlled, it may put you at risk for serious complications.  Your personal target blood pressure may vary depending on your medical conditions, your age, and other  factors. For most people, a normal blood pressure is  less than 120/80.  Hypertension is treated with lifestyle changes, medicines, or a combination of both. Lifestyle changes include losing weight, eating a healthy, low-sodium diet, exercising more, and limiting alcohol. This information is not intended to replace advice given to you by your health care provider. Make sure you discuss any questions you have with your health care provider. Document Released: 09/22/2005 Document Revised: 06/02/2018 Document Reviewed: 06/02/2018 Elsevier Patient Education  2020 [employer] Inc[employer]      Agustina Caroli, MD [facility]

## 2019-10-04 ENCOUNTER — Encounter: Payer: Self-pay | Admitting: Radiology

## 2019-10-04 LAB — GC/CHLAMYDIA PROBE AMP (~~LOC~~) NOT AT ARMC
Chlamydia: NEGATIVE
Comment: NEGATIVE
Comment: NORMAL
Neisseria Gonorrhea: NEGATIVE

## 2019-10-05 LAB — RPR+HSVIGM+HBSAG+HSV2(IGG)+...
HIV Screen 4th Generation wRfx: NONREACTIVE
HSV 2 IgG, Type Spec: 5.38 index — ABNORMAL HIGH (ref 0.00–0.90)
HSVI/II Comb IgM: <0.91 Ratio (ref 0.00–0.90)
Hepatitis B Surface Ag: NEGATIVE
RPR Ser Ql: NONREACTIVE

## 2019-10-24 ENCOUNTER — Other Ambulatory Visit: Payer: Self-pay | Admitting: Emergency Medicine

## 2019-10-24 DIAGNOSIS — F419 Anxiety disorder, unspecified: Secondary | ICD-10-CM

## 2019-11-10 ENCOUNTER — Telehealth: Payer: Self-pay | Admitting: *Deleted

## 2019-11-10 DIAGNOSIS — R102 Pelvic and perineal pain: Secondary | ICD-10-CM

## 2019-11-10 NOTE — Telephone Encounter (Signed)
Patient called history of right side lower pelvic pain, has Mirena IUD. Asked if you would be willing to order vaginal ultrasound, not able to feel IUD stings ( history of this as well) reports pain more so on right side,but can be bilateral at times, last ultrasound was done on 12/2018 and normal. She would prefer to see if you would okay ultrasound order verses coming for office visit for exam first and then needing another appointment for ultrasound. Please advise

## 2019-11-10 NOTE — Telephone Encounter (Signed)
Will route to appointment desk to schedule. Order placed.

## 2019-11-10 NOTE — Telephone Encounter (Signed)
Ultrasound and office visit scheduled for February 18.

## 2019-11-10 NOTE — Telephone Encounter (Signed)
That would be fine to do an ultrasound first, but probably would still be good to check on her during an appointment, so either way it would be two visits unless we can schedule them the same day?

## 2019-11-20 ENCOUNTER — Encounter: Payer: Self-pay | Admitting: *Deleted

## 2019-11-20 ENCOUNTER — Emergency Department
Admission: EM | Admit: 2019-11-20 | Discharge: 2019-11-20 | Disposition: A | Discharge status: Home / Self Care | Payer: BC Managed Care – PPO | Attending: Emergency Medicine | Admitting: Emergency Medicine

## 2019-11-20 ENCOUNTER — Other Ambulatory Visit: Payer: Self-pay

## 2019-11-20 DIAGNOSIS — I10 Essential (primary) hypertension: Secondary | ICD-10-CM | POA: Diagnosis not present

## 2019-11-20 DIAGNOSIS — Z87891 Personal history of nicotine dependence: Secondary | ICD-10-CM | POA: Diagnosis not present

## 2019-11-20 DIAGNOSIS — Z79899 Other long term (current) drug therapy: Secondary | ICD-10-CM | POA: Insufficient documentation

## 2019-11-20 DIAGNOSIS — Z041 Encounter for examination and observation following transport accident: Secondary | ICD-10-CM | POA: Diagnosis not present

## 2019-11-20 DIAGNOSIS — J45909 Unspecified asthma, uncomplicated: Secondary | ICD-10-CM | POA: Diagnosis not present

## 2019-11-20 DIAGNOSIS — M791 Myalgia, unspecified site: Secondary | ICD-10-CM | POA: Diagnosis not present

## 2019-11-20 MED ORDER — METHOCARBAMOL 500 MG PO TABS: 500.0000 mg | ORAL_TABLET | Freq: Three times a day (TID) | ORAL | 0 refills | Status: AC | PRN

## 2019-11-20 MED ORDER — MELOXICAM 15 MG PO TABS: 15.0000 mg | ORAL_TABLET | Freq: Every day | ORAL | 1 refills | Status: AC
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
## 2019-11-24 ENCOUNTER — Other Ambulatory Visit: Payer: BC Managed Care – PPO

## 2019-11-24 ENCOUNTER — Ambulatory Visit: Payer: BC Managed Care – PPO | Admitting: Obstetrics and Gynecology

## 2019-11-28 DIAGNOSIS — Z20822 Contact with and (suspected) exposure to covid-19: Secondary | ICD-10-CM | POA: Diagnosis not present

## 2019-11-29 ENCOUNTER — Encounter: Payer: Self-pay | Admitting: Emergency Medicine

## 2019-11-29 ENCOUNTER — Telehealth (INDEPENDENT_AMBULATORY_CARE_PROVIDER_SITE_OTHER): Payer: BC Managed Care – PPO | Admitting: Emergency Medicine

## 2019-11-29 ENCOUNTER — Other Ambulatory Visit: Payer: Self-pay

## 2019-11-29 VITALS — Ht 63.0 in | Wt 220.0 lb

## 2019-11-29 DIAGNOSIS — M542 Cervicalgia: Secondary | ICD-10-CM | POA: Diagnosis not present

## 2019-11-29 DIAGNOSIS — F411 Generalized anxiety disorder: Secondary | ICD-10-CM

## 2019-11-29 DIAGNOSIS — M549 Dorsalgia, unspecified: Secondary | ICD-10-CM

## 2019-11-29 DIAGNOSIS — J309 Allergic rhinitis, unspecified: Secondary | ICD-10-CM

## 2019-11-29 DIAGNOSIS — I10 Essential (primary) hypertension: Secondary | ICD-10-CM

## 2019-11-29 MED ORDER — CETIRIZINE HCL 10 MG PO TABS: 10.0000 mg | ORAL_TABLET | Freq: Every day | ORAL | 1 refills | Status: DC
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
## 2019-12-05 ENCOUNTER — Ambulatory Visit: Payer: BC Managed Care – PPO | Admitting: Obstetrics and Gynecology

## 2019-12-05 ENCOUNTER — Other Ambulatory Visit: Payer: BC Managed Care – PPO

## 2019-12-12 DIAGNOSIS — Z20822 Contact with and (suspected) exposure to covid-19: Secondary | ICD-10-CM | POA: Diagnosis not present

## 2019-12-20 ENCOUNTER — Ambulatory Visit (INDEPENDENT_AMBULATORY_CARE_PROVIDER_SITE_OTHER): Payer: BC Managed Care – PPO

## 2019-12-20 ENCOUNTER — Encounter: Payer: Self-pay | Admitting: Obstetrics and Gynecology

## 2019-12-20 ENCOUNTER — Ambulatory Visit: Payer: BC Managed Care – PPO | Admitting: Obstetrics and Gynecology

## 2019-12-20 ENCOUNTER — Other Ambulatory Visit: Payer: Self-pay

## 2019-12-20 VITALS — BP 118/76

## 2019-12-20 DIAGNOSIS — D251 Intramural leiomyoma of uterus: Secondary | ICD-10-CM

## 2019-12-20 DIAGNOSIS — R102 Pelvic and perineal pain: Secondary | ICD-10-CM

## 2019-12-20 DIAGNOSIS — D259 Leiomyoma of uterus, unspecified: Secondary | ICD-10-CM

## 2019-12-20 DIAGNOSIS — Z30431 Encounter for routine checking of intrauterine contraceptive device: Secondary | ICD-10-CM

## 2019-12-20 NOTE — Progress Notes (Signed)
   Alexis Donaldson  Aug 07, 1986 [id number]  HPI The patient is a 34 y.o. G0P0000 who presents today for a pelvic ultrasound.  She had called in about 6 weeks ago regarding right lower pelvic pain.  She does have a Mirena IUD in place since 04/2018.  She has not been able to feel the strings, which was also noted at her last examination in 03/2019.  The pelvic pain can also migrate to the left side.  She last had an ultrasound 12/2018 indicating everything was normal with normal positioning of the IUD.  There was a submucosal fibroid abutting the endometrial cavity in proximity to the IUD noted at that time.  She has had a longstanding history of her right lower quadrant pain intermittently.  The pain is episodic and can last from anywhere from 2 to 6 hours, typically occurs over a few day stretch every 4-6 weeks or so.  Pain does not radiate and is mostly in her right lower quadrant, but sometimes she feels it on her left lower quadrant as well.  The pain is controlled with application of heat pack, rest, and ibuprofen until it passes.  There is no association with the pain to eating, physical activity, defecation or urination, no relation to timing of her menstrual cycle.  He describes her pain tolerance is low.  She has been amenorrheic with the IUD in place, but in the last few months has developed spotting about once every month when she would be expecting her period normally.  She self describes her pain tolerance as low.  Past medical history,surgical history, problem list, medications, allergies, family history and social history were all reviewed and documented as reviewed in the EPIC chart.  ROS:  Feeling well. No dyspnea or chest pain on exertion.  No abdominal pain, change in bowel habits, black or bloody stools.  No urinary tract symptoms. GYN ROS:  no abnormal bleeding, no discharge.  Physical Exam  BP 118/76   General: Pleasant female, no acute distress, alert and oriented  PELVIC EXAM:  Deferred as pelvic ultrasound was performed today  Pelvic ultrasound Anteverted uterus 7.9 x 4.4 x 3.1 cm. IUD noted in proper position within the endometrial cavity. 13 x 7 mm submucosal fibroid abutting the IUD, stable from previous ultrasound. Bilateral ovaries normal with normal small follicular pattern.  Normal perfusion. No adnexal masses.  No free fluid.  Assessment 34 yo G0 with few year history of episodic right lower quadrant pain, Mirena IUD in place  Plan Pelvic ultrasound today is reassuring.  Images and report are shared with the patient indicating the IUD is in a normal location in the uterus.  Her bilateral ovaries have tiny follicle cysts but no dominant cyst that would be expected to be causing pain.  She does have a stable submucosal fibroid that seems to abut the IUD, and this is probably not the source of the symptoms that she has been having, but it is possible.  She could consider trialing off of the IUD when this one is due to be removed in 2024, but this may not be very fruitful.  It does not sound to be related to endometriosis because it is not very cyclic in nature, it seems rather sporadic.  I think gastrointestinal related causes would be more likely to explain this symptom, she will plan to just monitor for now and will seek evaluation if it gets acutely worse.  Joseph Pierini MD 12/20/19

## 2020-02-06 ENCOUNTER — Encounter: Payer: Self-pay | Admitting: Emergency Medicine

## 2020-02-15 ENCOUNTER — Other Ambulatory Visit: Payer: Self-pay | Admitting: *Deleted

## 2020-02-15 ENCOUNTER — Telehealth: Payer: Self-pay | Admitting: Emergency Medicine

## 2020-02-15 DIAGNOSIS — Z113 Encounter for screening for infections with a predominantly sexual mode of transmission: Secondary | ICD-10-CM

## 2020-02-15 DIAGNOSIS — I10 Essential (primary) hypertension: Secondary | ICD-10-CM

## 2020-02-15 DIAGNOSIS — Z13220 Encounter for screening for lipoid disorders: Secondary | ICD-10-CM

## 2020-02-15 NOTE — Telephone Encounter (Signed)
Future orders are pending for Lab only appt on 03/28/2020.

## 2020-02-15 NOTE — Telephone Encounter (Signed)
Pt has a nurse visit sch for 03/28/20 for her appt on 04/02/20 for a 6 month f/u. Needing the orders for this appt put in. Please advise.

## 2020-03-28 ENCOUNTER — Ambulatory Visit: Payer: BC Managed Care – PPO

## 2020-03-28 ENCOUNTER — Other Ambulatory Visit: Payer: Self-pay

## 2020-03-28 DIAGNOSIS — Z113 Encounter for screening for infections with a predominantly sexual mode of transmission: Secondary | ICD-10-CM | POA: Diagnosis not present

## 2020-03-28 DIAGNOSIS — I10 Essential (primary) hypertension: Secondary | ICD-10-CM | POA: Diagnosis not present

## 2020-03-28 DIAGNOSIS — Z13220 Encounter for screening for lipoid disorders: Secondary | ICD-10-CM | POA: Diagnosis not present

## 2020-03-29 LAB — CBC WITH DIFFERENTIAL/PLATELET
Basophils Absolute: 0 10*3/uL (ref 0.0–0.2)
Basos: 1 %
EOS (ABSOLUTE): 0.2 10*3/uL (ref 0.0–0.4)
Eos: 3 %
Hematocrit: 38.3 % (ref 34.0–46.6)
Hemoglobin: 12.7 g/dL (ref 11.1–15.9)
Immature Grans (Abs): 0 10*3/uL (ref 0.0–0.1)
Immature Granulocytes: 1 %
Lymphocytes Absolute: 1.3 10*3/uL (ref 0.7–3.1)
Lymphs: 28 %
MCH: 27.4 pg (ref 26.6–33.0)
MCHC: 33.2 g/dL (ref 31.5–35.7)
MCV: 83 fL (ref 79–97)
Monocytes Absolute: 0.5 10*3/uL (ref 0.1–0.9)
Monocytes: 10 %
Neutrophils Absolute: 2.8 10*3/uL (ref 1.4–7.0)
Neutrophils: 57 %
Platelets: 292 10*3/uL (ref 150–450)
RBC: 4.64 x10E6/uL (ref 3.77–5.28)
RDW: 13.5 % (ref 11.7–15.4)
WBC: 4.8 10*3/uL (ref 3.4–10.8)

## 2020-03-29 LAB — COMPREHENSIVE METABOLIC PANEL
ALT: 18 IU/L (ref 0–32)
AST: 18 IU/L (ref 0–40)
Albumin/Globulin Ratio: 1.1 — ABNORMAL LOW (ref 1.2–2.2)
Albumin: 4 g/dL (ref 3.8–4.8)
Alkaline Phosphatase: 103 IU/L (ref 48–121)
BUN/Creatinine Ratio: 6 — ABNORMAL LOW (ref 9–23)
BUN: 5 mg/dL — ABNORMAL LOW (ref 6–20)
Bilirubin Total: 0.3 mg/dL (ref 0.0–1.2)
CO2: 22 mmol/L (ref 20–29)
Calcium: 9.2 mg/dL (ref 8.7–10.2)
Chloride: 100 mmol/L (ref 96–106)
Creatinine, Ser: 0.81 mg/dL (ref 0.57–1.00)
GFR calc Af Amer: 110 mL/min/{1.73_m2} (ref >59)
GFR calc non Af Amer: 96 mL/min/{1.73_m2} (ref >59)
Globulin, Total: 3.8 g/dL (ref 1.5–4.5)
Glucose: 95 mg/dL (ref 65–99)
Potassium: 4.1 mmol/L (ref 3.5–5.2)
Sodium: 135 mmol/L (ref 134–144)
Total Protein: 7.8 g/dL (ref 6.0–8.5)

## 2020-03-29 LAB — LIPID PANEL
Chol/HDL Ratio: 6.9 ratio — ABNORMAL HIGH (ref 0.0–4.4)
Cholesterol, Total: 180 mg/dL (ref 100–199)
HDL: 26 mg/dL — ABNORMAL LOW (ref >39)
LDL Chol Calc (NIH): 119 mg/dL — ABNORMAL HIGH (ref 0–99)
Triglycerides: 196 mg/dL — ABNORMAL HIGH (ref 0–149)
VLDL Cholesterol Cal: 35 mg/dL (ref 5–40)

## 2020-03-29 LAB — RPR+HSVIGM+HBSAG+HSV2(IGG)+...
HIV Screen 4th Generation wRfx: NONREACTIVE
HSV 2 IgG, Type Spec: 6.87 {index} — ABNORMAL HIGH (ref 0.00–0.90)
HSVI/II Comb IgM: <0.91 ratio (ref 0.00–0.90)
Hepatitis B Surface Ag: NEGATIVE
RPR Ser Ql: NONREACTIVE

## 2020-04-02 ENCOUNTER — Ambulatory Visit: Payer: BC Managed Care – PPO | Admitting: Emergency Medicine

## 2020-04-02 ENCOUNTER — Encounter: Payer: Self-pay | Admitting: Emergency Medicine

## 2020-04-02 ENCOUNTER — Other Ambulatory Visit: Payer: Self-pay

## 2020-04-02 DIAGNOSIS — Z6838 Body mass index (BMI) 38.0-38.9, adult: Secondary | ICD-10-CM

## 2020-04-02 DIAGNOSIS — G8929 Other chronic pain: Secondary | ICD-10-CM

## 2020-04-02 DIAGNOSIS — F419 Anxiety disorder, unspecified: Secondary | ICD-10-CM

## 2020-04-02 DIAGNOSIS — I10 Essential (primary) hypertension: Secondary | ICD-10-CM

## 2020-04-02 DIAGNOSIS — J309 Allergic rhinitis, unspecified: Secondary | ICD-10-CM | POA: Diagnosis not present

## 2020-04-02 DIAGNOSIS — J4530 Mild persistent asthma, uncomplicated: Secondary | ICD-10-CM

## 2020-04-02 DIAGNOSIS — M545 Low back pain: Secondary | ICD-10-CM

## 2020-04-02 DIAGNOSIS — E66812 Obesity, class 2: Secondary | ICD-10-CM

## 2020-04-02 MED ORDER — CETIRIZINE HCL 10 MG PO TABS: 10.0000 mg | ORAL_TABLET | Freq: Every day | ORAL | 1 refills | Status: DC

## 2020-04-02 MED ORDER — CYCLOBENZAPRINE HCL 5 MG PO TABS: 5.0000 mg | ORAL_TABLET | Freq: Every day | ORAL | 0 refills | Status: DC | PRN

## 2020-04-02 MED ORDER — ALBUTEROL SULFATE HFA 108 (90 BASE) MCG/ACT IN AERS: 2.0000 | INHALATION_SPRAY | RESPIRATORY_TRACT | 1 refills | Status: DC | PRN

## 2020-04-02 MED ORDER — BUSPIRONE HCL 7.5 MG PO TABS: 7.5000 mg | ORAL_TABLET | Freq: Two times a day (BID) | ORAL | 1 refills | Status: DC

## 2020-04-02 MED ORDER — IBUPROFEN 800 MG PO TABS: 800.0000 mg | ORAL_TABLET | Freq: Four times a day (QID) | ORAL | 0 refills | Status: DC | PRN

## 2020-04-02 MED ORDER — FLUTICASONE PROPIONATE 50 MCG/ACT NA SUSP: 2.0000 | Freq: Every day | NASAL | 3 refills | Status: DC

## 2020-04-02 MED ORDER — BECLOMETHASONE DIPROPIONATE 40 MCG/ACT IN AERS: 1.0000 | INHALATION_SPRAY | Freq: Two times a day (BID) | RESPIRATORY_TRACT | 1 refills | Status: AC

## 2020-04-02 MED ORDER — LISINOPRIL 10 MG PO TABS: 10.0000 mg | ORAL_TABLET | Freq: Every day | ORAL | 3 refills | Status: DC

## 2020-04-02 MED ORDER — MONTELUKAST SODIUM 10 MG PO TABS: 10.0000 mg | ORAL_TABLET | Freq: Every day | ORAL | 3 refills | Status: DC

## 2020-04-02 MED ORDER — AMLODIPINE BESYLATE 5 MG PO TABS: 5.0000 mg | ORAL_TABLET | Freq: Every day | ORAL | 3 refills | Status: DC

## 2020-04-02 MED ORDER — HYDROXYZINE HCL 25 MG PO TABS: ORAL_TABLET | ORAL | 0 refills | Status: DC

## 2020-04-02 NOTE — Progress Notes (Signed)
Alexis Donaldson 34 y.o.   Chief Complaint  Patient presents with  . Hypertension    6 months follow up and lab review    HISTORY OF PRESENT ILLNESS: This is a 34 y.o. female with history of hypertension here for follow-up. Presently on amlodipine 5 mg daily and lisinopril 10 mg daily. Blood pressure readings at home within normal limits. BP Readings from Last 3 Encounters:  04/02/20 (!) 151/90  12/20/19 118/76  11/20/19 (!) 148/101  Recent blood work results reviewed with patient. Unremarkable labs with acceptable values. Lipid profile is slightly abnormal. Diet, nutrition, and exercise recommended. No complaints or medical concerns today. Had mild case of Covid infection earlier this year. Was also vaccinated against Covid. Recent Results (from the past 2160 hour(s))  CBC with Differential/Platelet     Status: None   Collection Time: 03/28/20 10:46 AM  Result Value Ref Range   WBC 4.8 3.4 - 10.8 x10E3/uL   RBC 4.64 3.77 - 5.28 x10E6/uL   Hemoglobin 12.7 11.1 - 15.9 g/dL   Hematocrit 38.3 34.0 - 46.6 %   MCV 83 79 - 97 fL   MCH 27.4 26.6 - 33.0 pg   MCHC 33.2 31 - 35 g/dL   RDW 13.5 11.7 - 15.4 %   Platelets 292 150 - 450 x10E3/uL   Neutrophils 57 Not Estab. %   Lymphs 28 Not Estab. %   Monocytes 10 Not Estab. %   Eos 3 Not Estab. %   Basos 1 Not Estab. %   Neutrophils Absolute 2.8 1 - 7 x10E3/uL   Lymphocytes Absolute 1.3 0 - 3 x10E3/uL   Monocytes Absolute 0.5 0 - 0 x10E3/uL   EOS (ABSOLUTE) 0.2 0.0 - 0.4 x10E3/uL   Basophils Absolute 0.0 0 - 0 x10E3/uL   Immature Granulocytes 1 Not Estab. %   Immature Grans (Abs) 0.0 0.0 - 0.1 x10E3/uL  Lipid panel     Status: Abnormal   Collection Time: 03/28/20 10:46 AM  Result Value Ref Range   Cholesterol, Total 180 100 - 199 mg/dL   Triglycerides 196 (H) 0 - 149 mg/dL   HDL 26 (L) >39 mg/dL   VLDL Cholesterol Cal 35 5 - 40 mg/dL   LDL Chol Calc (NIH) 119 (H) 0 - 99 mg/dL   Chol/HDL Ratio 6.9 (H) 0.0 - 4.4 ratio    Comment:                                    T. Chol/HDL Ratio                                             Men  Women                               1/2 Avg.Risk  3.4    3.3                                   Avg.Risk  5.0    4.4  2X Avg.Risk  9.6    7.1                                3X Avg.Risk 23.4   11.0   Comprehensive metabolic panel     Status: Abnormal   Collection Time: 03/28/20 10:46 AM  Result Value Ref Range   Glucose 95 65 - 99 mg/dL   BUN 5 (L) 6 - 20 mg/dL   Creatinine, Ser 0.81 0.57 - 1.00 mg/dL   GFR calc non Af Amer 96 >59 mL/min/1.73   GFR calc Af Amer 110 >59 mL/min/1.73    Comment: **[facility] currently reports eGFR in compliance with the current**   recommendations of the [employer]. [facility] will   update reporting as new guidelines are published from the NKF-ASN   Task force.    BUN/Creatinine Ratio 6 (L) 9 - 23   Sodium 135 134 - 144 mmol/L   Potassium 4.1 3.5 - 5.2 mmol/L   Chloride 100 96 - 106 mmol/L   CO2 22 20 - 29 mmol/L   Calcium 9.2 8.7 - 10.2 mg/dL   Total Protein 7.8 6.0 - 8.5 g/dL   Albumin 4.0 3.8 - 4.8 g/dL   Globulin, Total 3.8 1.5 - 4.5 g/dL   Albumin/Globulin Ratio 1.1 (L) 1.2 - 2.2   Bilirubin Total 0.3 0.0 - 1.2 mg/dL   Alkaline Phosphatase 103 48 - 121 IU/L   AST 18 0 - 40 IU/L   ALT 18 0 - 32 IU/L  STD Panel     Status: Abnormal   Collection Time: 03/28/20 10:46 AM  Result Value Ref Range   Hepatitis B Surface Ag Negative Negative   RPR Ser Ql Non Reactive Non Reactive   HIV Screen 4th Generation wRfx Non Reactive Non Reactive   HSVI/II Comb IgM <0.91 0.00 - 0.90 Ratio    Comment:                                  Negative        <0.91                                  Equivocal 0.91 - 1.09                                  Positive        >1.09    HSV 2 IgG, Type Spec 6.87 (H) 0.00 - 0.90 index    Comment:                                  Negative        <0.91                                   Equivocal 0.91 - 1.09                                  Positive        >1.09  Note: Negative indicates no  antibodies detected to  HSV-2. Equivocal may suggest early infection.  If  clinically appropriate, retest at later date. Positive  indicates antibodies detected to HSV-2.      HPI   Prior to Admission medications   Medication Sig Start Date End Date Taking? Authorizing Provider  albuterol (PROVENTIL HFA;VENTOLIN HFA) 108 (90 Base) MCG/ACT inhaler Inhale 2 puffs into the lungs every 4 (four) hours as needed for wheezing or shortness of breath (cough, shortness of breath or wheezing.). 01/14/17  Yes Timmothy Euler, [place] D, PA-C  amLODipine (NORVASC) 5 MG tablet Take 1 tablet (5 mg total) by mouth daily. 01/31/19  Yes Nephi Savage, Ines Bloomer, MD  beclomethasone (QVAR) 40 MCG/ACT inhaler Inhale 1 puff into the lungs 2 (two) times daily. 01/03/19  Yes Finley Dinkel, Ines Bloomer, MD  busPIRone (BUSPAR) 7.5 MG tablet Take 7.5 mg by mouth 3 (three) times daily.   Yes [provider]  cetirizine (ZYRTEC) 10 MG tablet Take 1 tablet (10 mg total) by mouth daily. 11/29/19  Yes Tijuana Scheidegger, Ines Bloomer, MD  cyclobenzaprine (FLEXERIL) 5 MG tablet Take 1 tablet (5 mg total) by mouth daily as needed for muscle spasms. 09/20/19  Yes Jarion Hawthorne, Ines Bloomer, MD  fluticasone [facility]) 50 MCG/ACT nasal spray Place 2 sprays into both nostrils daily. 01/03/19  Yes Marshall Kampf, Ines Bloomer, MD  hydrOXYzine (ATARAX/VISTARIL) 25 MG tablet TAKE 1 TO 2 TABLETS BY MOUTH THREE TIMES A DAY AS NEEDED 10/24/19  Yes Mariana Wiederholt, Ines Bloomer, MD  ibuprofen (ADVIL) 800 MG tablet Take 1 tablet (800 mg total) by mouth every 6 (six) hours as needed (as needed for pain). 09/13/19  Yes Eleena Grater, Ines Bloomer, MD  levonorgestrel [facility]) 20 MCG/24HR IUD by Intrauterine route.   Yes [provider]  lisinopril (ZESTRIL) 10 MG tablet Take 1 tablet (10 mg total) by mouth daily. 02/09/19  Yes Nachum Derossett, Ines Bloomer, MD  montelukast (SINGULAIR)  10 MG tablet Take 1 tablet (10 mg total) by mouth at bedtime. 01/31/19  Yes Evaleigh Mccamy, Ines Bloomer, MD  Blood Pressure Monitoring (BLOOD PRESSURE KIT) DEVI 1 Units by Does not apply route daily. 12/12/16   Tenna Delaine D, PA-C    Allergies  Allergen Reactions  . Effexor [Venlafaxine] Anxiety  . Zoloft [Sertraline Hcl] Anxiety and Other (See Comments)    Patient Active Problem List   Diagnosis Date Noted  . Chronic bilateral low back pain without sciatica 01/03/2019  . Chronic anxiety 01/03/2019  . Essential hypertension 01/03/2019    Past Medical History:  Diagnosis Date  . Allergy   . Anxiety   . Asthma   . Cervical dysplasia   . Depression   . HSV-1 (herpes simplex virus 1) infection   . HSV-2 (herpes simplex virus 2) infection   . Hypertension   . STD (sexually transmitted disease)    Chlamydia X2,HSV    Past Surgical History:  Procedure Laterality Date  . CERVICAL BIOPSY    . COLPOSCOPY    . GYNECOLOGIC CRYOSURGERY    . Mirena     Inserted 04/07/2018  . WISDOM TOOTH EXTRACTION      Social History   Socioeconomic History  . Marital status: Single    Spouse name: Not on file  . Number of children: 0  . Years of education: Not on file  . Highest education level: Not on file  Occupational History  . Not on file  Tobacco Use  . Smoking status: Former Smoker    Packs/day: 0.25    Years: 4.00  Pack years: 1.00    Types: Cigarettes  . Smokeless tobacco: Never Used  Vaping Use  . Vaping Use: Never used  Substance and Sexual Activity  . Alcohol use: Yes    Alcohol/week: 8.0 standard drinks    Types: 4 Glasses of wine, 1 Cans of beer, 3 Shots of liquor per week  . Drug use: Yes    Types: Marijuana    Comment: Occas  . Sexual activity: Yes    Birth control/protection: I.U.D.    Comment: Mirena inserted 04/07/2018-1st intercourse 34 yo-More than 5 partners  Other Topics Concern  . Not on file  Social History Narrative   Program supervisor   Social  Determinants of Health   Financial Resource Strain:   . Difficulty of Paying Living Expenses:   Food Insecurity:   . Worried About Running Out of Food in the Last Year:   . Ran Out of Food in the Last Year:   Transportation Needs:   . Lack of Transportation (Medical):   . Lack of Transportation (Non-Medical):   Physical Activity:   . Days of Exercise per Week:   . Minutes of Exercise per Session:   Stress:   . Feeling of Stress :   Social Connections:   . Frequency of Communication with Friends and Family:   . Frequency of Social Gatherings with Friends and Family:   . Attends Religious Services:   . Active Member of Clubs or Organizations:   . Attends Club or Organization Meetings:   . Marital Status:   Intimate Partner Violence:   . Fear of Current or Ex-Partner:   . Emotionally Abused:   . Physically Abused:   . Sexually Abused:     Family History  Problem Relation Age of Onset  . Hyperlipidemia Mother   . Hypertension Mother   . Hypertension Father   . Heart attack Father   . Mental illness Sister   . Diabetes Maternal Grandmother   . Cancer Maternal Grandmother        Lung  . Cancer Maternal Grandfather        Lung  . Hypertension Maternal Grandfather   . Heart disease Maternal Grandfather   . Mental illness Paternal Grandmother   . Hypertension Paternal Grandfather   . Stroke Paternal Grandfather   . Mental illness Sister      Review of Systems  Constitutional: Negative.  Negative for chills and fever.  HENT: Negative.  Negative for congestion and sore throat.   Eyes: Negative.   Respiratory: Negative.  Negative for cough and shortness of breath.   Cardiovascular: Negative.  Negative for chest pain and palpitations.  Gastrointestinal: Negative.  Negative for abdominal pain, blood in stool, constipation, diarrhea, nausea and vomiting.  Genitourinary: Negative.  Negative for dysuria.  Musculoskeletal: Negative.  Negative for myalgias.  Skin: Negative.   Negative for rash.  Neurological: Negative.  Negative for dizziness and headaches.  All other systems reviewed and are negative.   Today's Vitals   04/02/20 1005  BP: (!) 151/90  Pulse: 70  Resp: 16  Temp: 98 F (36.7 C)  TempSrc: Temporal  SpO2: 98%  Weight: 206 lb (93.4 kg)  Height: 5' 2.75" (1.594 m)   Body mass index is 36.78 kg/m.  Physical Exam Vitals reviewed.  Constitutional:      Appearance: Normal appearance. She is obese.  HENT:     Head: Normocephalic.  Eyes:     Extraocular Movements: Extraocular movements intact.     Conjunctiva/sclera:   Conjunctivae normal.     Pupils: Pupils are equal, round, and reactive to light.  Cardiovascular:     Rate and Rhythm: Normal rate and regular rhythm.     Pulses: Normal pulses.     Heart sounds: Normal heart sounds.  Pulmonary:     Effort: Pulmonary effort is normal.     Breath sounds: Normal breath sounds.  Musculoskeletal:        General: Normal range of motion.     Cervical back: Normal range of motion and neck supple.  Skin:    General: Skin is warm and dry.     Capillary Refill: Capillary refill takes less than 2 seconds.  Neurological:     General: No focal deficit present.     Mental Status: She is alert and oriented to person, place, and time.  Psychiatric:        Mood and Affect: Mood normal.        Behavior: Behavior normal.    A total of 30 minutes was spent with the patient, greater than 50% of which was in counseling/coordination of care regarding hypertension and cardiovascular risks associated with this condition, review of all medications and side effects, review of most recent blood work results, diet and nutrition, prognosis and need for follow-up.   ASSESSMENT & PLAN Essential hypertension Blood pressure elevated in the office but normal at home. Continue present medications. No changes. Diet and nutrition discussed. Blood results reviewed with patient. Follow-up in 6 months.  Alexis Donaldson was seen  today for hypertension and medication refill.  Diagnoses and all orders for this visit:  Class 2 severe obesity with serious comorbidity and body mass index (BMI) of 38.0 to 38.9 in adult, unspecified obesity type (HCC)  Essential hypertension -     amLODipine (NORVASC) 5 MG tablet; Take 1 tablet (5 mg total) by mouth daily.  Mild persistent asthma without complication -     beclomethasone (QVAR) 40 MCG/ACT inhaler; Inhale 1 puff into the lungs 2 (two) times daily.  Allergic rhinitis, unspecified seasonality, unspecified trigger -     cetirizine (ZYRTEC) 10 MG tablet; Take 1 tablet (10 mg total) by mouth daily. -     fluticasone (FLONASE) 50 MCG/ACT nasal spray; Place 2 sprays into both nostrils daily.  Chronic bilateral low back pain without sciatica -     cyclobenzaprine (FLEXERIL) 5 MG tablet; Take 1 tablet (5 mg total) by mouth daily as needed for muscle spasms. -     ibuprofen (ADVIL) 800 MG tablet; Take 1 tablet (800 mg total) by mouth every 6 (six) hours as needed (as needed for pain).  Chronic anxiety -     hydrOXYzine (ATARAX/VISTARIL) 25 MG tablet; TAKE 1 TO 2 TABLETS BY MOUTH THREE TIMES A DAY AS NEEDED  Uncontrolled hypertension -     lisinopril (ZESTRIL) 10 MG tablet; Take 1 tablet (10 mg total) by mouth daily.  Other orders -     albuterol (VENTOLIN HFA) 108 (90 Base) MCG/ACT inhaler; Inhale 2 puffs into the lungs every 4 (four) hours as needed for wheezing or shortness of breath (cough, shortness of breath or wheezing.). -     busPIRone (BUSPAR) 7.5 MG tablet; Take 1 tablet (7.5 mg total) by mouth 2 (two) times daily. -     montelukast (SINGULAIR) 10 MG tablet; Take 1 tablet (10 mg total) by mouth at bedtime.    Patient Instructions       If you have lab work done today you will  be contacted with your lab results within the next 2 weeks.  If you have not heard from us then please contact us. The fastest way to get your results is to register for My Chart.   IF  you received an x-ray today, you will receive an invoice from [facility]. Please contact [facility] at [phone] with questions or concerns regarding your invoice.   IF you received labwork today, you will receive an invoice from [facility]. Please contact [facility] at [phone] with questions or concerns regarding your invoice.   Our billing staff will not be able to assist you with questions regarding bills from these companies.  You will be contacted with the lab results as soon as they are available. The fastest way to get your results is to activate your My Chart account. Instructions are located on the last page of this paperwork. If you have not heard from us regarding the results in 2 weeks, please contact this office.     Hypertension, Adult High blood pressure (hypertension) is when the force of blood pumping through the arteries is too strong. The arteries are the blood vessels that carry blood from the heart throughout the body. Hypertension forces the heart to work harder to pump blood and may cause arteries to become narrow or stiff. Untreated or uncontrolled hypertension can cause a heart attack, heart failure, a stroke, kidney disease, and other problems. A blood pressure reading consists of a higher number over a lower number. Ideally, your blood pressure should be below 120/80. The first ("top") number is called the systolic pressure. It is a measure of the pressure in your arteries as your heart beats. The second ("bottom") number is called the diastolic pressure. It is a measure of the pressure in your arteries as the heart relaxes. What are the causes? The exact cause of this condition is not known. There are some conditions that result in or are related to high blood pressure. What increases the risk? Some risk factors for high blood pressure are under your control. The following factors may make you more likely to develop this  condition:  Smoking.  Having type 2 diabetes mellitus, high cholesterol, or both.  Not getting enough exercise or physical activity.  Being overweight.  Having too much fat, sugar, calories, or salt (sodium) in your diet.  Drinking too much alcohol. Some risk factors for high blood pressure may be difficult or impossible to change. Some of these factors include:  Having chronic kidney disease.  Having a family history of high blood pressure.  Age. Risk increases with age.  Race. You may be at higher risk if you are African American.  Gender. Men are at higher risk than women before age 45. After age 65, women are at higher risk than men.  Having obstructive sleep apnea.  Stress. What are the signs or symptoms? High blood pressure may not cause symptoms. Very high blood pressure (hypertensive crisis) may cause:  Headache.  Anxiety.  Shortness of breath.  Nosebleed.  Nausea and vomiting.  Vision changes.  Severe chest pain.  Seizures. How is this diagnosed? This condition is diagnosed by measuring your blood pressure while you are seated, with your arm resting on a flat surface, your legs uncrossed, and your feet flat on the floor. The cuff of the blood pressure monitor will be placed directly against the skin of your upper arm at the level of your heart. It should be measured at least twice using the same arm.   Certain conditions can cause a difference in blood pressure between your right and left arms. Certain factors can cause blood pressure readings to be lower or higher than normal for a short period of time:  When your blood pressure is higher when you are in a health care provider's office than when you are at home, this is called white coat hypertension. Most people with this condition do not need medicines.  When your blood pressure is higher at home than when you are in a health care provider's office, this is called masked hypertension. Most people with  this condition may need medicines to control blood pressure. If you have a high blood pressure reading during one visit or you have normal blood pressure with other risk factors, you may be asked to:  Return on a different day to have your blood pressure checked again.  Monitor your blood pressure at home for 1 week or longer. If you are diagnosed with hypertension, you may have other blood or imaging tests to help your health care provider understand your overall risk for other conditions. How is this treated? This condition is treated by making healthy lifestyle changes, such as eating healthy foods, exercising more, and reducing your alcohol intake. Your health care provider may prescribe medicine if lifestyle changes are not enough to get your blood pressure under control, and if:  Your systolic blood pressure is above 130.  Your diastolic blood pressure is above 80. Your personal target blood pressure may vary depending on your medical conditions, your age, and other factors. Follow these instructions at home: Eating and drinking   Eat a diet that is high in fiber and potassium, and low in sodium, added sugar, and fat. An example eating plan is called the DASH (Dietary Approaches to Stop Hypertension) diet. To eat this way: ? Eat plenty of fresh fruits and vegetables. Try to fill one half of your plate at each meal with fruits and vegetables. ? Eat whole grains, such as whole-wheat pasta, brown rice, or whole-grain bread. Fill about one fourth of your plate with whole grains. ? Eat or drink low-fat dairy products, such as skim milk or low-fat yogurt. ? Avoid fatty cuts of meat, processed or cured meats, and poultry with skin. Fill about one fourth of your plate with lean proteins, such as fish, chicken without skin, beans, eggs, or tofu. ? Avoid pre-made and processed foods. These tend to be higher in sodium, added sugar, and fat.  Reduce your daily sodium intake. Most people with  hypertension should eat less than 1,500 mg of sodium a day.  Do not drink alcohol if: ? Your health care provider tells you not to drink. ? You are pregnant, may be pregnant, or are planning to become pregnant.  If you drink alcohol: ? Limit how much you use to:  0-1 drink a day for women.  0-2 drinks a day for men. ? Be aware of how much alcohol is in your drink. In the [place] one drink equals one 12 oz bottle of beer (355 mL), one 5 oz glass of wine (148 mL), or one 1 oz glass of hard liquor (44 mL). Lifestyle   Work with your health care provider to maintain a healthy body weight or to lose weight. Ask what an ideal weight is for you.  Get at least 30 minutes of exercise most days of the week. Activities may include walking, swimming, or biking.  Include exercise to strengthen your muscles (resistance exercise), such   as Pilates or lifting weights, as part of your weekly exercise routine. Try to do these types of exercises for 30 minutes at least 3 days a week.  Do not use any products that contain nicotine or tobacco, such as cigarettes, e-cigarettes, and chewing tobacco. If you need help quitting, ask your health care provider.  Monitor your blood pressure at home as told by your health care provider.  Keep all follow-up visits as told by your health care provider. This is important. Medicines  Take over-the-counter and prescription medicines only as told by your health care provider. Follow directions carefully. Blood pressure medicines must be taken as prescribed.  Do not skip doses of blood pressure medicine. Doing this puts you at risk for problems and can make the medicine less effective.  Ask your health care provider about side effects or reactions to medicines that you should watch for. Contact a health care provider if you:  Think you are having a reaction to a medicine you are taking.  Have headaches that keep coming back (recurring).  Feel dizzy.  Have  swelling in your ankles.  Have trouble with your vision. Get help right away if you:  Develop a severe headache or confusion.  Have unusual weakness or numbness.  Feel faint.  Have severe pain in your chest or abdomen.  Vomit repeatedly.  Have trouble breathing. Summary  Hypertension is when the force of blood pumping through your arteries is too strong. If this condition is not controlled, it may put you at risk for serious complications.  Your personal target blood pressure may vary depending on your medical conditions, your age, and other factors. For most people, a normal blood pressure is less than 120/80.  Hypertension is treated with lifestyle changes, medicines, or a combination of both. Lifestyle changes include losing weight, eating a healthy, low-sodium diet, exercising more, and limiting alcohol. This information is not intended to replace advice given to you by your health care provider. Make sure you discuss any questions you have with your health care provider. Document Revised: 06/02/2018 Document Reviewed: 06/02/2018 Elsevier Patient Education  2020 [employer]     Miguel Sagardia, MD [facility] 

## 2020-04-02 NOTE — Assessment & Plan Note (Signed)
Blood pressure elevated in the office but normal at home. Continue present medications. No changes. Diet and nutrition discussed. Blood results reviewed with patient. Follow-up in 6 months.

## 2020-04-02 NOTE — Patient Instructions (Addendum)
   If you have lab work done today you will be contacted with your lab results within the next 2 weeks.  If you have not heard from us then please contact us. The fastest way to get your results is to register for My Chart.   IF you received an x-ray today, you will receive an invoice from [facility]. Please contact [facility] at [phone] with questions or concerns regarding your invoice.   IF you received labwork today, you will receive an invoice from [facility]. Please contact [facility] at [phone] with questions or concerns regarding your invoice.   Our billing staff will not be able to assist you with questions regarding bills from these companies.  You will be contacted with the lab results as soon as they are available. The fastest way to get your results is to activate your My Chart account. Instructions are located on the last page of this paperwork. If you have not heard from us regarding the results in 2 weeks, please contact this office.      Hypertension, Adult High blood pressure (hypertension) is when the force of blood pumping through the arteries is too strong. The arteries are the blood vessels that carry blood from the heart throughout the body. Hypertension forces the heart to work harder to pump blood and may cause arteries to become narrow or stiff. Untreated or uncontrolled hypertension can cause a heart attack, heart failure, a stroke, kidney disease, and other problems. A blood pressure reading consists of a higher number over a lower number. Ideally, your blood pressure should be below 120/80. The first ("top") number is called the systolic pressure. It is a measure of the pressure in your arteries as your heart beats. The second ("bottom") number is called the diastolic pressure. It is a measure of the pressure in your arteries as the heart relaxes. What are the causes? The exact cause of this condition is not known. There are some conditions  that result in or are related to high blood pressure. What increases the risk? Some risk factors for high blood pressure are under your control. The following factors may make you more likely to develop this condition:  Smoking.  Having type 2 diabetes mellitus, high cholesterol, or both.  Not getting enough exercise or physical activity.  Being overweight.  Having too much fat, sugar, calories, or salt (sodium) in your diet.  Drinking too much alcohol. Some risk factors for high blood pressure may be difficult or impossible to change. Some of these factors include:  Having chronic kidney disease.  Having a family history of high blood pressure.  Age. Risk increases with age.  Race. You may be at higher risk if you are African American.  Gender. Men are at higher risk than women before age 45. After age 65, women are at higher risk than men.  Having obstructive sleep apnea.  Stress. What are the signs or symptoms? High blood pressure may not cause symptoms. Very high blood pressure (hypertensive crisis) may cause:  Headache.  Anxiety.  Shortness of breath.  Nosebleed.  Nausea and vomiting.  Vision changes.  Severe chest pain.  Seizures. How is this diagnosed? This condition is diagnosed by measuring your blood pressure while you are seated, with your arm resting on a flat surface, your legs uncrossed, and your feet flat on the floor. The cuff of the blood pressure monitor will be placed directly against the skin of your upper arm at the level of your   heart. It should be measured at least twice using the same arm. Certain conditions can cause a difference in blood pressure between your right and left arms. Certain factors can cause blood pressure readings to be lower or higher than normal for a short period of time:  When your blood pressure is higher when you are in a health care provider's office than when you are at home, this is called white coat hypertension.  Most people with this condition do not need medicines.  When your blood pressure is higher at home than when you are in a health care provider's office, this is called masked hypertension. Most people with this condition may need medicines to control blood pressure. If you have a high blood pressure reading during one visit or you have normal blood pressure with other risk factors, you may be asked to:  Return on a different day to have your blood pressure checked again.  Monitor your blood pressure at home for 1 week or longer. If you are diagnosed with hypertension, you may have other blood or imaging tests to help your health care provider understand your overall risk for other conditions. How is this treated? This condition is treated by making healthy lifestyle changes, such as eating healthy foods, exercising more, and reducing your alcohol intake. Your health care provider may prescribe medicine if lifestyle changes are not enough to get your blood pressure under control, and if:  Your systolic blood pressure is above 130.  Your diastolic blood pressure is above 80. Your personal target blood pressure may vary depending on your medical conditions, your age, and other factors. Follow these instructions at home: Eating and drinking   Eat a diet that is high in fiber and potassium, and low in sodium, added sugar, and fat. An example eating plan is called the DASH (Dietary Approaches to Stop Hypertension) diet. To eat this way: ? Eat plenty of fresh fruits and vegetables. Try to fill one half of your plate at each meal with fruits and vegetables. ? Eat whole grains, such as whole-wheat pasta, brown rice, or whole-grain bread. Fill about one fourth of your plate with whole grains. ? Eat or drink low-fat dairy products, such as skim milk or low-fat yogurt. ? Avoid fatty cuts of meat, processed or cured meats, and poultry with skin. Fill about one fourth of your plate with lean proteins, such  as fish, chicken without skin, beans, eggs, or tofu. ? Avoid pre-made and processed foods. These tend to be higher in sodium, added sugar, and fat.  Reduce your daily sodium intake. Most people with hypertension should eat less than 1,500 mg of sodium a day.  Do not drink alcohol if: ? Your health care provider tells you not to drink. ? You are pregnant, may be pregnant, or are planning to become pregnant.  If you drink alcohol: ? Limit how much you use to:  0-1 drink a day for women.  0-2 drinks a day for men. ? Be aware of how much alcohol is in your drink. In the [place] one drink equals one 12 oz bottle of beer (355 mL), one 5 oz glass of wine (148 mL), or one 1 oz glass of hard liquor (44 mL). Lifestyle   Work with your health care provider to maintain a healthy body weight or to lose weight. Ask what an ideal weight is for you.  Get at least 30 minutes of exercise most days of the week. Activities may include walking, swimming,   or biking.  Include exercise to strengthen your muscles (resistance exercise), such as Pilates or lifting weights, as part of your weekly exercise routine. Try to do these types of exercises for 30 minutes at least 3 days a week.  Do not use any products that contain nicotine or tobacco, such as cigarettes, e-cigarettes, and chewing tobacco. If you need help quitting, ask your health care provider.  Monitor your blood pressure at home as told by your health care provider.  Keep all follow-up visits as told by your health care provider. This is important. Medicines  Take over-the-counter and prescription medicines only as told by your health care provider. Follow directions carefully. Blood pressure medicines must be taken as prescribed.  Do not skip doses of blood pressure medicine. Doing this puts you at risk for problems and can make the medicine less effective.  Ask your health care provider about side effects or reactions to medicines that you  should watch for. Contact a health care provider if you:  Think you are having a reaction to a medicine you are taking.  Have headaches that keep coming back (recurring).  Feel dizzy.  Have swelling in your ankles.  Have trouble with your vision. Get help right away if you:  Develop a severe headache or confusion.  Have unusual weakness or numbness.  Feel faint.  Have severe pain in your chest or abdomen.  Vomit repeatedly.  Have trouble breathing. Summary  Hypertension is when the force of blood pumping through your arteries is too strong. If this condition is not controlled, it may put you at risk for serious complications.  Your personal target blood pressure may vary depending on your medical conditions, your age, and other factors. For most people, a normal blood pressure is less than 120/80.  Hypertension is treated with lifestyle changes, medicines, or a combination of both. Lifestyle changes include losing weight, eating a healthy, low-sodium diet, exercising more, and limiting alcohol. This information is not intended to replace advice given to you by your health care provider. Make sure you discuss any questions you have with your health care provider. Document Revised: 06/02/2018 Document Reviewed: 06/02/2018 Elsevier Patient Education  2020 [employer]  

## 2020-04-18 ENCOUNTER — Telehealth: Payer: Self-pay | Admitting: Emergency Medicine

## 2020-04-18 NOTE — Telephone Encounter (Signed)
Referral Followup °

## 2020-06-13 DIAGNOSIS — F411 Generalized anxiety disorder: Secondary | ICD-10-CM | POA: Diagnosis not present

## 2020-08-16 ENCOUNTER — Other Ambulatory Visit: Payer: Self-pay

## 2020-08-16 ENCOUNTER — Ambulatory Visit: Payer: BC Managed Care – PPO | Admitting: Family Medicine

## 2020-08-16 ENCOUNTER — Encounter: Payer: Self-pay | Admitting: Family Medicine

## 2020-08-16 VITALS — BP 156/98 | HR 85 | Temp 98.2°F | Ht 62.75 in | Wt 212.0 lb

## 2020-08-16 DIAGNOSIS — J309 Allergic rhinitis, unspecified: Secondary | ICD-10-CM | POA: Diagnosis not present

## 2020-08-16 DIAGNOSIS — R197 Diarrhea, unspecified: Secondary | ICD-10-CM | POA: Diagnosis not present

## 2020-08-16 DIAGNOSIS — R1030 Lower abdominal pain, unspecified: Secondary | ICD-10-CM

## 2020-08-16 DIAGNOSIS — Z113 Encounter for screening for infections with a predominantly sexual mode of transmission: Secondary | ICD-10-CM

## 2020-08-16 LAB — POCT URINALYSIS DIP (MANUAL ENTRY)
Glucose, UA: NEGATIVE mg/dL
Leukocytes, UA: NEGATIVE
Nitrite, UA: NEGATIVE
Spec Grav, UA: >=1.03 — AB (ref 1.010–1.025)
Urobilinogen, UA: 2 E.U./dL — AB
pH, UA: 5.5 (ref 5.0–8.0)

## 2020-08-16 MED ORDER — LOPERAMIDE HCL 2 MG PO TABS: 2.0000 mg | ORAL_TABLET | Freq: Four times a day (QID) | ORAL | 0 refills | Status: AC | PRN

## 2020-08-16 MED ORDER — DICYCLOMINE HCL 10 MG PO CAPS: 10.0000 mg | ORAL_CAPSULE | Freq: Three times a day (TID) | ORAL | 0 refills | Status: DC

## 2020-08-16 MED ORDER — MOMETASONE FUROATE 50 MCG/ACT NA SUSP: 2.0000 | Freq: Every day | NASAL | 3 refills | Status: DC

## 2020-08-16 NOTE — Progress Notes (Addendum)
11/11/20213:32 PM  Grissel Tyrell Oct 29, 1985, 34 y.o., female [id number]  Chief Complaint  Patient presents with  . std screening    general screen  . Abdominal Pain    sharp pain x 2 weeks pelvis going up epeigastrc area   . Menorrhagia    x 2 week l, has IUD in place 2 yrs  . Allergic Reaction    seasonal - stuffy nose    HPI:   Patient is a 34 y.o. female with past medical history significant for HTN who presents today for abdominal pain and diarrhea.  Shooting pain from pelvis to epigastric for 2 weeks 30 seconds to 5 min. Feels like cramps, but worse Not connected to meals or bowel movements Extreme pain Has not tried anything for pain. Diarrhea for week: Yesterday 6  Normally 2-3 per day Regular BM prior No changes in diet Food intolerances: beef, pork  Break-thru bleeding, which Is uncommon for her Lots of blood clots Has occurred 5 days over past 14 days IUD for 2 years Mirena Don't typically have a period at all Sometimes once a month, but that Beauford Lando started in July  New sexual partner Requests STI testing   Depression screen [facility] 2/9 08/16/2020 04/02/2020 11/29/2019  Decreased Interest 0 2 2  Down, Depressed, Hopeless 0 1 1  PHQ - 2 Score 0 3 3  Altered sleeping - 3 3  Tired, decreased energy - 2 1  Change in appetite - 1 2  Feeling bad or failure about yourself  - 0 1  Trouble concentrating - 0 2  Moving slowly or fidgety/restless - 1 0  Suicidal thoughts - 0 0  PHQ-9 Score - 10 12  Some recent data might be hidden    Fall Risk  08/16/2020 04/02/2020 11/29/2019 10/03/2019 08/17/2019  Falls in the past year? 0 0 0 0 0  Number falls in past yr: 0 - - - -  Injury with Fall? 0 - - - -  Follow up Falls evaluation completed Falls evaluation completed Falls evaluation completed Falls evaluation completed Falls evaluation completed     Allergies  Allergen Reactions  . Effexor [Venlafaxine] Anxiety  . Zoloft [Sertraline Hcl] Anxiety and Other (See  Comments)    Prior to Admission medications   Medication Sig Start Date End Date Taking? Authorizing Provider  albuterol (VENTOLIN HFA) 108 (90 Base) MCG/ACT inhaler Inhale 2 puffs into the lungs every 4 (four) hours as needed for wheezing or shortness of breath (cough, shortness of breath or wheezing.). 04/02/20  Yes Sagardia, Ines Bloomer, MD  amLODipine (NORVASC) 5 MG tablet Take 1 tablet (5 mg total) by mouth daily. 04/02/20  Yes Sagardia, Ines Bloomer, MD  beclomethasone (QVAR) 40 MCG/ACT inhaler Inhale 1 puff into the lungs 2 (two) times daily. 04/02/20  Yes Sagardia, Ines Bloomer, MD  Blood Pressure Monitoring (BLOOD PRESSURE KIT) DEVI 1 Units by Does not apply route daily. 12/12/16  Yes Timmothy Euler, [place] D, PA-C  busPIRone (BUSPAR) 7.5 MG tablet Take 1 tablet (7.5 mg total) by mouth 2 (two) times daily. 04/02/20  Yes Sagardia, Ines Bloomer, MD  cetirizine (ZYRTEC) 10 MG tablet Take 1 tablet (10 mg total) by mouth daily. 04/02/20  Yes Sagardia, Ines Bloomer, MD  cyclobenzaprine (FLEXERIL) 5 MG tablet Take 1 tablet (5 mg total) by mouth daily as needed for muscle spasms. 04/02/20  Yes Sagardia, Ines Bloomer, MD  fluticasone [facility]) 50 MCG/ACT nasal spray Place 2 sprays into both nostrils daily. 04/02/20  Yes Sagardia,  Ines Bloomer, MD  hydrOXYzine (ATARAX/VISTARIL) 25 MG tablet TAKE 1 TO 2 TABLETS BY MOUTH THREE TIMES A DAY AS NEEDED 04/02/20  Yes Sagardia, Ines Bloomer, MD  ibuprofen (ADVIL) 800 MG tablet Take 1 tablet (800 mg total) by mouth every 6 (six) hours as needed (as needed for pain). 04/02/20  Yes Sagardia, Ines Bloomer, MD  levonorgestrel [facility]) 20 MCG/24HR IUD by Intrauterine route.   Yes [provider]  lisinopril (ZESTRIL) 10 MG tablet Take 1 tablet (10 mg total) by mouth daily. 04/02/20  Yes Sagardia, Ines Bloomer, MD  montelukast (SINGULAIR) 10 MG tablet Take 1 tablet (10 mg total) by mouth at bedtime. 04/02/20  Yes Horald Pollen, MD    Past Medical History:  Diagnosis Date   . Allergy   . Anxiety   . Asthma   . Cervical dysplasia   . Depression   . HSV-1 (herpes simplex virus 1) infection   . HSV-2 (herpes simplex virus 2) infection   . Hypertension   . STD (sexually transmitted disease)    Chlamydia X2,HSV    Past Surgical History:  Procedure Laterality Date  . CERVICAL BIOPSY    . COLPOSCOPY    . GYNECOLOGIC CRYOSURGERY    . Mirena     Inserted 04/07/2018  . WISDOM TOOTH EXTRACTION      Social History   Tobacco Use  . Smoking status: Former Smoker    Packs/day: 0.25    Years: 4.00    Pack years: 1.00    Types: Cigarettes  . Smokeless tobacco: Never Used  Substance Use Topics  . Alcohol use: Yes    Alcohol/week: 8.0 standard drinks    Types: 4 Glasses of wine, 1 Cans of beer, 3 Shots of liquor per week    Family History  Problem Relation Age of Onset  . Hyperlipidemia Mother   . Hypertension Mother   . Hypertension Father   . Heart attack Father   . Mental illness Sister   . Diabetes Maternal Grandmother   . Cancer Maternal Grandmother        Lung  . Cancer Maternal Grandfather        Lung  . Hypertension Maternal Grandfather   . Heart disease Maternal Grandfather   . Mental illness Paternal Grandmother   . Hypertension Paternal Grandfather   . Stroke Paternal Grandfather   . Mental illness Sister     Review of Systems  Constitutional: Negative for chills, fever and malaise/fatigue.  HENT: Positive for congestion.   Eyes: Negative for blurred vision and double vision.  Respiratory: Negative for cough, shortness of breath and wheezing.   Cardiovascular: Negative for chest pain, palpitations and leg swelling.  Gastrointestinal: Positive for abdominal pain and diarrhea. Negative for blood in stool, constipation, heartburn, nausea and vomiting.  Genitourinary: Negative for dysuria, frequency and hematuria.  Musculoskeletal: Negative for back pain and joint pain.  Skin: Negative for rash.  Neurological: Negative for  dizziness, weakness and headaches.     OBJECTIVE:  Today's Vitals   08/16/20 1412  BP: (!) 156/98  Pulse: 85  Temp: 98.2 F (36.8 C)  SpO2: 99%  Weight: 212 lb (96.2 kg)  Height: 5' 2.75" (1.594 m)   Body mass index is 37.85 kg/m.   Physical Exam Constitutional:      General: She is not in acute distress.    Appearance: Normal appearance. She is not ill-appearing.  HENT:     Head: Normocephalic.  Cardiovascular:     Rate  and Rhythm: Normal rate and regular rhythm.     Pulses: Normal pulses.     Heart sounds: Normal heart sounds. No murmur heard.  No friction rub. No gallop.   Pulmonary:     Effort: Pulmonary effort is normal. No respiratory distress.     Breath sounds: Normal breath sounds. No stridor. No wheezing, rhonchi or rales.  Abdominal:     General: Bowel sounds are normal.     Palpations: Abdomen is soft.     Tenderness: There is no abdominal tenderness.  Musculoskeletal:     Right lower leg: No edema.     Left lower leg: No edema.  Skin:    General: Skin is warm and dry.  Neurological:     Mental Status: She is alert and oriented to person, place, and time.  Psychiatric:        Mood and Affect: Mood normal.        Behavior: Behavior normal.     Results for orders placed or performed in visit on 08/16/20 (from the past 24 hour(s))  POCT urinalysis dipstick     Status: Abnormal   Collection Time: 08/16/20  3:09 PM  Result Value Ref Range   Color, UA yellow yellow   Clarity, UA clear clear   Glucose, UA negative negative mg/dL   Bilirubin, UA small (A) negative   Ketones, POC UA moderate (40) (A) negative mg/dL   Spec Grav, UA >=1.030 (A) 1.010 - 1.025   Blood, UA trace-lysed (A) negative   pH, UA 5.5 5.0 - 8.0   Protein Ur, POC trace (A) negative mg/dL   Urobilinogen, UA 2.0 (A) 0.2 or 1.0 E.U./dL   Nitrite, UA Negative Negative   Leukocytes, UA Negative Negative    No results found.   ASSESSMENT and PLAN  Problem List Items Addressed  This Visit    None    Visit Diagnoses    Routine screening for STI (sexually transmitted infection)    -  Primary   Relevant Orders   RPR   GC/Chlamydia Probe Amp([facility])   HIV Antibody (routine testing w rflx)   POCT urinalysis dipstick (Completed)   WET PREP FOR TRICH, YEAST, CLUE   Lower abdominal pain       Relevant Medications   dicyclomine (BENTYL) 10 MG capsule as needed   Other Relevant Orders   CBC   POCT urinalysis dipstick (Completed):  Positive for bilirubins, ketones, blood, protein Recheck once not dehydrated   WET PREP FOR TRICH, YEAST, CLUE   Allergic rhinitis, unspecified seasonality, unspecified trigger       Relevant Medications   mometasone (NASONEX) 50 MCG/ACT nasal spray Continue to use flonase and cetirizine daily.   Diarrhea, unspecified type       Relevant Medications   loperamide (IMODIUM A-D) 2 MG tablet as needed Encouraged to increase fluids Discussed plan of stool culture and abdominal CT if symptoms worsen or do not improve.     Will follow up with lab results  Return in about 2 weeks (around 08/30/2020), or if symptoms worsen or fail to improve.    Huston Foley Emilyann Banka, FNP-BC [facility] [place], [place] [address] Ph.  [phone] Fax [phone]  I have reviewed and agree with above documentation. Agustina Caroli, MD

## 2020-08-16 NOTE — Patient Instructions (Addendum)
Viral Gastroenteritis, Adult  Viral gastroenteritis is also known as the stomach flu. This condition may affect your stomach, your small intestine, and your large intestine. It can cause sudden watery poop (diarrhea), fever, and throwing up (vomiting). This condition is caused by certain germs (viruses). These germs can be passed from person to person very easily (are contagious). Having watery poop and throwing up can make you feel weak and cause you to not have enough water in your body (get dehydrated). This can make you tired and thirsty, make you have a dry mouth, and make it so you pee (urinate) less often. It is important to replace the fluids that you lose from having watery poop and throwing up. What are the causes?  You can get sick by catching viruses from other people.  You can also get sick by: ? Eating food, drinking water, or touching a surface that has the viruses on it (is contaminated). ? Sharing utensils or other personal items with a person who is sick. What increases the risk?  Having a weak body defense system (immune system).  Living with one or more children who are younger than 2 years old.  Living in a nursing home.  Going on cruise ships. What are the signs or symptoms? Symptoms of this condition start suddenly. Symptoms may last for a few days or for as long as a week.  Common symptoms include: ? Watery poop. ? Throwing up.  Other symptoms include: ? Fever. ? Headache. ? Feeling tired (fatigue). ? Pain in the belly (abdomen). ? Chills. ? Feeling weak. ? Feeling sick to your stomach (nauseous). ? Muscle aches. ? Not feeling hungry. How is this treated?  This condition typically goes away on its own.  The focus of treatment is to replace the fluids that you lose. This condition may be treated with: ? An ORS (oral rehydration solution). This is a drink that is sold at pharmacies and stores. ? Medicines to help with your symptoms. ? Probiotic  supplements to reduce symptoms of diarrhea. ? Fluids given through an IV tube, if needed.  Older adults and people with other diseases or a weak body defense system are at higher risk for not having enough water in the body. Follow these instructions at home: Eating and drinking   Take an ORS as told by your doctor.  Drink clear fluids in small amounts as you are able. Clear fluids include: ? Water. ? Ice chips. ? Fruit juice with water added to it (diluted). ? Low-calorie sports drinks.  Drink enough fluid to keep your pee (urine) pale yellow.  Eat small amounts of healthy foods every 3-4 hours as you are able. This may include whole grains, fruits, vegetables, lean meats, and yogurt.  Avoid fluids that have a lot of sugar or caffeine in them, such as energy drinks, sports drinks, and soda.  Avoid spicy or fatty foods.  Avoid alcohol. General instructions   Wash your hands often. This is very important after you have watery poop or you throw up. If you cannot use soap and water, use hand sanitizer.  Make sure that all people in your home wash their hands well and often.  Take over-the-counter and prescription medicines only as told by your doctor.  Rest at home while you get better.  Watch your condition for any changes.  Take a warm bath to help with any burning or pain from having watery poop.  Keep all follow-up visits as told by your doctor.   This is important. Contact a doctor if:  You cannot keep fluids down.  Your symptoms get worse.  You have new symptoms.  You feel light-headed.  You feel dizzy.  You have muscle cramps. Get help right away if:  You have chest pain.  You feel very weak.  You pass out (faint).  You see blood in your throw-up.  Your throw-up looks like coffee grounds.  You have bloody or black poop (stools) or poop that looks like tar.  You have a very bad headache, or a stiff neck, or both.  You have a rash.  You have  very bad pain, cramping, or bloating in your belly.  You have trouble breathing.  You are breathing very quickly.  You have a fast heartbeat.  Your skin feels cold and clammy.  You feel mixed up (confused).  You have pain when you pee.  You have signs of not having enough water in the body, such as: ? Dark pee, hardly any pee, or no pee. ? Cracked lips. ? Dry mouth. ? Sunken eyes. ? Feeling very sleepy. ? Feeling weak. Summary  Viral gastroenteritis is also known as the stomach flu.  This condition can cause sudden watery poop (diarrhea), fever, and throwing up (vomiting).  These germs can be passed from person to person very easily.  Take an ORS as told by your doctor. This is a drink that is sold at pharmacies and stores.  Drink fluids in small amounts many times each day as you are able. This information is not intended to replace advice given to you by your health care provider. Make sure you discuss any questions you have with your health care provider. Document Revised: 07/28/2018 Document Reviewed: 07/28/2018 Elsevier Patient Education  [employer].     If you have lab work done today you will be contacted with your lab results within the next 2 weeks.  If you have not heard from [place] then please contact us. The fastest way to get your results is to register for My Chart.   IF you received an x-ray today, you will receive an invoice from [facility]. Please contact [facility] at [phone] with questions or concerns regarding your invoice.   IF you received labwork today, you will receive an invoice from [place]. Please contact [facility] at [phone] with questions or concerns regarding your invoice.   Our billing staff will not be able to assist you with questions regarding bills from these companies.  You will be contacted with the lab results as soon as they are available. The fastest way to get your results is to activate your My  Chart account. Instructions are located on the last page of this paperwork. If you have not heard from [place] regarding the results in 2 weeks, please contact this office.

## 2020-08-17 ENCOUNTER — Encounter: Payer: Self-pay | Admitting: Family Medicine

## 2020-08-17 LAB — CBC
Hematocrit: 40.9 % (ref 34.0–46.6)
Hemoglobin: 13.4 g/dL (ref 11.1–15.9)
MCH: 27.2 pg (ref 26.6–33.0)
MCHC: 32.8 g/dL (ref 31.5–35.7)
MCV: 83 fL (ref 79–97)
Platelets: 305 10*3/uL (ref 150–450)
RBC: 4.93 x10E6/uL (ref 3.77–5.28)
RDW: 13.7 % (ref 11.7–15.4)
WBC: 4.2 10*3/uL (ref 3.4–10.8)

## 2020-08-17 LAB — HIV ANTIBODY (ROUTINE TESTING W REFLEX): HIV Screen 4th Generation wRfx: NONREACTIVE

## 2020-08-17 LAB — RPR: RPR Ser Ql: NONREACTIVE

## 2020-08-17 LAB — WET PREP FOR TRICH, YEAST, CLUE
Clue Cell Exam: NEGATIVE
Trichomonas Exam: NEGATIVE
Yeast Exam: NEGATIVE

## 2020-08-19 LAB — GC/CHLAMYDIA PROBE AMP
Chlamydia trachomatis, NAA: NEGATIVE
Neisseria Gonorrhoeae by PCR: NEGATIVE

## 2020-09-01 ENCOUNTER — Encounter: Payer: Self-pay | Admitting: Family Medicine

## 2020-09-14 ENCOUNTER — Telehealth: Payer: Self-pay | Admitting: Emergency Medicine

## 2020-09-14 NOTE — Telephone Encounter (Signed)
Called pt to let her know that her provider will not be in the office on 10/02/20 and will need to resch appt. Please advise.

## 2020-10-02 ENCOUNTER — Ambulatory Visit: Payer: BC Managed Care – PPO | Admitting: Emergency Medicine

## 2020-10-03 ENCOUNTER — Ambulatory Visit: Payer: BC Managed Care – PPO | Admitting: Emergency Medicine

## 2020-10-03 ENCOUNTER — Other Ambulatory Visit (HOSPITAL_COMMUNITY)
Admission: RE | Admit: 2020-10-03 | Discharge: 2020-10-03 | Disposition: A | Discharge status: Home / Self Care | Payer: BC Managed Care – PPO | Source: Ambulatory Visit | Attending: Emergency Medicine | Admitting: Emergency Medicine

## 2020-10-03 ENCOUNTER — Encounter: Payer: Self-pay | Admitting: Emergency Medicine

## 2020-10-03 ENCOUNTER — Other Ambulatory Visit: Payer: Self-pay

## 2020-10-03 VITALS — BP 146/99 | HR 123 | Temp 98.0°F | Resp 16 | Ht 62.75 in | Wt 211.0 lb

## 2020-10-03 DIAGNOSIS — F419 Anxiety disorder, unspecified: Secondary | ICD-10-CM

## 2020-10-03 DIAGNOSIS — Z113 Encounter for screening for infections with a predominantly sexual mode of transmission: Secondary | ICD-10-CM | POA: Diagnosis not present

## 2020-10-03 DIAGNOSIS — Z23 Encounter for immunization: Secondary | ICD-10-CM

## 2020-10-03 DIAGNOSIS — G8929 Other chronic pain: Secondary | ICD-10-CM

## 2020-10-03 DIAGNOSIS — J309 Allergic rhinitis, unspecified: Secondary | ICD-10-CM | POA: Diagnosis not present

## 2020-10-03 DIAGNOSIS — M545 Low back pain, unspecified: Secondary | ICD-10-CM

## 2020-10-03 DIAGNOSIS — I10 Essential (primary) hypertension: Secondary | ICD-10-CM | POA: Diagnosis not present

## 2020-10-03 MED ORDER — MONTELUKAST SODIUM 10 MG PO TABS: 10.0000 mg | ORAL_TABLET | Freq: Every day | ORAL | 3 refills | Status: DC

## 2020-10-03 MED ORDER — CETIRIZINE HCL 10 MG PO TABS: 10.0000 mg | ORAL_TABLET | Freq: Every day | ORAL | 1 refills | Status: DC

## 2020-10-03 MED ORDER — LISINOPRIL 20 MG PO TABS: 20.0000 mg | ORAL_TABLET | Freq: Every day | ORAL | 3 refills | Status: DC

## 2020-10-03 MED ORDER — FLUTICASONE PROPIONATE 50 MCG/ACT NA SUSP: 2.0000 | Freq: Every day | NASAL | 3 refills | Status: DC

## 2020-10-03 MED ORDER — ALBUTEROL SULFATE HFA 108 (90 BASE) MCG/ACT IN AERS: 2.0000 | INHALATION_SPRAY | RESPIRATORY_TRACT | 1 refills | Status: AC | PRN

## 2020-10-03 MED ORDER — IBUPROFEN 800 MG PO TABS: 800.0000 mg | ORAL_TABLET | Freq: Four times a day (QID) | ORAL | 0 refills | Status: DC | PRN

## 2020-10-03 MED ORDER — CYCLOBENZAPRINE HCL 5 MG PO TABS: 5.0000 mg | ORAL_TABLET | Freq: Every day | ORAL | 0 refills | Status: DC | PRN

## 2020-10-03 MED ORDER — HYDROXYZINE HCL 25 MG PO TABS: ORAL_TABLET | ORAL | 0 refills | Status: DC

## 2020-10-03 MED ORDER — AMLODIPINE BESYLATE 5 MG PO TABS: 5.0000 mg | ORAL_TABLET | Freq: Every day | ORAL | 3 refills | Status: DC

## 2020-10-03 NOTE — Progress Notes (Signed)
Alexis Donaldson 34 y.o.   Chief Complaint  Patient presents with  . Hypertension    Follow up   . STD testing    Per patient she wants to be tested not sure exposure  . Medication Refill    Pend    HISTORY OF PRESENT ILLNESS: This is a 34 y.o. female with history of hypertension here for follow-up.  Did not take blood pressure medications today. Needs medication refills. Has a new partner, concerned about STD exposure.  Asymptomatic. History of chronic anxiety.  Had a very stressful day today at work. No other complaints or medical concerns today.  HPI   Prior to Admission medications   Medication Sig Start Date End Date Taking? Authorizing Provider  albuterol (VENTOLIN HFA) 108 (90 Base) MCG/ACT inhaler Inhale 2 puffs into the lungs every 4 (four) hours as needed for wheezing or shortness of breath (cough, shortness of breath or wheezing.). 04/02/20  Yes Naydeen Speirs, Ines Bloomer, MD  amLODipine (NORVASC) 5 MG tablet Take 1 tablet (5 mg total) by mouth daily. 04/02/20  Yes Gwendola Hornaday, Ines Bloomer, MD  beclomethasone (QVAR) 40 MCG/ACT inhaler Inhale 1 puff into the lungs 2 (two) times daily. 04/02/20  Yes Jenille Laszlo, Ines Bloomer, MD  busPIRone (BUSPAR) 7.5 MG tablet Take 1 tablet (7.5 mg total) by mouth 2 (two) times daily. 04/02/20  Yes Sha Amer, Ines Bloomer, MD  cetirizine (ZYRTEC) 10 MG tablet Take 1 tablet (10 mg total) by mouth daily. 04/02/20  Yes Najmah Carradine, Ines Bloomer, MD  cyclobenzaprine (FLEXERIL) 5 MG tablet Take 1 tablet (5 mg total) by mouth daily as needed for muscle spasms. 04/02/20  Yes Margret Moat, Ines Bloomer, MD  fluticasone [facility]) 50 MCG/ACT nasal spray Place 2 sprays into both nostrils daily. 04/02/20  Yes Jonaven Hilgers, Ines Bloomer, MD  hydrOXYzine (ATARAX/VISTARIL) 25 MG tablet TAKE 1 TO 2 TABLETS BY MOUTH THREE TIMES A DAY AS NEEDED 04/02/20  Yes Tyshawna Alarid, Ines Bloomer, MD  ibuprofen (ADVIL) 800 MG tablet Take 1 tablet (800 mg total) by mouth every 6 (six) hours as needed (as needed  for pain). 04/02/20  Yes Ayeden Gladman, Ines Bloomer, MD  levonorgestrel [facility]) 20 MCG/24HR IUD by Intrauterine route.   Yes [provider]  lisinopril (ZESTRIL) 10 MG tablet Take 1 tablet (10 mg total) by mouth daily. 04/02/20  Yes Romonia Yanik, Ines Bloomer, MD  montelukast (SINGULAIR) 10 MG tablet Take 1 tablet (10 mg total) by mouth at bedtime. 04/02/20  Yes Barney Russomanno, Ines Bloomer, MD  Blood Pressure Monitoring (BLOOD PRESSURE KIT) DEVI 1 Units by Does not apply route daily. 12/12/16   Tenna Delaine D, PA-C  dicyclomine (BENTYL) 10 MG capsule Take 1 capsule (10 mg total) by mouth 4 (four) times daily -  before meals and at bedtime for 10 days. 08/16/20 08/26/20  Just, Laurita Quint, FNP  mometasone (NASONEX) 50 MCG/ACT nasal spray Place 2 sprays into the nose daily. No longer than 3 weeks Patient not taking: Reported on 10/03/2020 08/16/20   Just, Laurita Quint, FNP    Allergies  Allergen Reactions  . Effexor [Venlafaxine] Anxiety  . Zoloft [Sertraline Hcl] Anxiety and Other (See Comments)    Patient Active Problem List   Diagnosis Date Noted  . Class 2 severe obesity with serious comorbidity and body mass index (BMI) of 38.0 to 38.9 in adult, unspecified obesity type (Otho) 04/02/2020  . Chronic bilateral low back pain without sciatica 01/03/2019  . Chronic anxiety 01/03/2019  . Essential hypertension 01/03/2019    Past Medical History:  Diagnosis Date  . Allergy   . Anxiety   . Asthma   . Cervical dysplasia   . Depression   . HSV-1 (herpes simplex virus 1) infection   . HSV-2 (herpes simplex virus 2) infection   . Hypertension   . STD (sexually transmitted disease)    Chlamydia X2,HSV    Past Surgical History:  Procedure Laterality Date  . CERVICAL BIOPSY    . COLPOSCOPY    . GYNECOLOGIC CRYOSURGERY    . Mirena     Inserted 04/07/2018  . WISDOM TOOTH EXTRACTION      Social History   Socioeconomic History  . Marital status: Single    Spouse name: Not on file  . Number of  children: 0  . Years of education: Not on file  . Highest education level: Not on file  Occupational History  . Not on file  Tobacco Use  . Smoking status: Former Smoker    Packs/day: 0.25    Years: 4.00    Pack years: 1.00    Types: Cigarettes  . Smokeless tobacco: Never Used  Vaping Use  . Vaping Use: Never used  Substance and Sexual Activity  . Alcohol use: Yes    Alcohol/week: 8.0 standard drinks    Types: 4 Glasses of wine, 1 Cans of beer, 3 Shots of liquor per week  . Drug use: Yes    Types: Marijuana    Comment: Occas  . Sexual activity: Yes    Birth control/protection: I.U.D.    Comment: Mirena inserted 04/07/2018-1st intercourse 34 yo-More than 5 partners  Other Topics Concern  . Not on file  Social History Narrative   Program supervisor   Social Determinants of Health   Financial Resource Strain: Not on file  Food Insecurity: Not on file  Transportation Needs: Not on file  Physical Activity: Not on file  Stress: Not on file  Social Connections: Not on file  Intimate Partner Violence: Not on file    Family History  Problem Relation Age of Onset  . Hyperlipidemia Mother   . Hypertension Mother   . Hypertension Father   . Heart attack Father   . Mental illness Sister   . Diabetes Maternal Grandmother   . Cancer Maternal Grandmother        Lung  . Cancer Maternal Grandfather        Lung  . Hypertension Maternal Grandfather   . Heart disease Maternal Grandfather   . Mental illness Paternal Grandmother   . Hypertension Paternal Grandfather   . Stroke Paternal Grandfather   . Mental illness Sister      Review of Systems  Constitutional: Negative.  Negative for chills and fever.  HENT: Negative.  Negative for congestion and sore throat.   Respiratory: Negative.  Negative for cough and shortness of breath.   Cardiovascular: Negative.  Negative for chest pain and palpitations.  Gastrointestinal: Negative.  Negative for abdominal pain, blood in stool,  diarrhea, melena, nausea and vomiting.  Genitourinary: Negative.  Negative for dysuria and hematuria.  Musculoskeletal: Negative.  Negative for back pain, myalgias and neck pain.  Skin: Negative.  Negative for rash.  Neurological: Negative.  Negative for dizziness and headaches.  All other systems reviewed and are negative.   Today's Vitals   10/03/20 1625  BP: (!) 146/99  Pulse: (!) 123  Resp: 16  Temp: 98 F (36.7 C)  TempSrc: Temporal  SpO2: 97%  Weight: 211 lb (95.7 kg)  Height: 5' 2.75" (1.594 m)  Body mass index is 37.68 kg/m. Wt Readings from Last 3 Encounters:  10/03/20 211 lb (95.7 kg)  08/16/20 212 lb (96.2 kg)  04/02/20 206 lb (93.4 kg)    Physical Exam Vitals reviewed.  Constitutional:      Appearance: Normal appearance.  HENT:     Head: Normocephalic.  Eyes:     Extraocular Movements: Extraocular movements intact.     Pupils: Pupils are equal, round, and reactive to light.  Cardiovascular:     Rate and Rhythm: Normal rate and regular rhythm.     Pulses: Normal pulses.     Heart sounds: Normal heart sounds.  Pulmonary:     Effort: Pulmonary effort is normal.     Breath sounds: Normal breath sounds.  Musculoskeletal:        General: Normal range of motion.     Cervical back: Normal range of motion.  Skin:    General: Skin is warm and dry.     Capillary Refill: Capillary refill takes less than 2 seconds.  Neurological:     General: No focal deficit present.     Mental Status: She is alert and oriented to person, place, and time.  Psychiatric:        Behavior: Behavior normal.     Comments: Started to cry stating she had a very stressful day.    A total of 30 minutes was spent with the patient, greater than 50% of which was in counseling/coordination of care regarding hypertension and cardiovascular risks associated with this condition, review of all medications and doses change, education on nutrition, review of most recent office visit notes,  review of most recent blood work results, prognosis, documentation, and need for follow-up.   ASSESSMENT & PLAN: Essential hypertension Uncontrolled hypertension.  Patient did not take medications today. Continue amlodipine 5 mg daily and increase lisinopril to 20 mg daily. Diet and nutrition discussed. Follow-up in 3 to 6 months. Idy was seen today for hypertension, std testing and medication refill.  Diagnoses and all orders for this visit:  Uncontrolled hypertension  Essential hypertension -     amLODipine (NORVASC) 5 MG tablet; Take 1 tablet (5 mg total) by mouth daily. -     lisinopril (ZESTRIL) 20 MG tablet; Take 1 tablet (20 mg total) by mouth daily.  Allergic rhinitis, unspecified seasonality, unspecified trigger -     cetirizine (ZYRTEC) 10 MG tablet; Take 1 tablet (10 mg total) by mouth daily. -     fluticasone (FLONASE) 50 MCG/ACT nasal spray; Place 2 sprays into both nostrils daily.  Chronic bilateral low back pain without sciatica -     cyclobenzaprine (FLEXERIL) 5 MG tablet; Take 1 tablet (5 mg total) by mouth daily as needed for muscle spasms. -     ibuprofen (ADVIL) 800 MG tablet; Take 1 tablet (800 mg total) by mouth every 6 (six) hours as needed (as needed for pain).  Chronic anxiety -     hydrOXYzine (ATARAX/VISTARIL) 25 MG tablet; TAKE 1 TO 2 TABLETS BY MOUTH THREE TIMES A DAY AS NEEDED  Need for diphtheria-tetanus-pertussis (Tdap) vaccine -     Tdap vaccine greater than or equal to 7yo IM  Screen for STD (sexually transmitted disease) -     STD Panel -     GC/Chlamydia probe amp ([place])not at [facility]  Other orders -     albuterol (VENTOLIN HFA) 108 (90 Base) MCG/ACT inhaler; Inhale 2 puffs into the lungs every 4 (four) hours as needed for  wheezing or shortness of breath (cough, shortness of breath or wheezing.). -     montelukast (SINGULAIR) 10 MG tablet; Take 1 tablet (10 mg total) by mouth at bedtime.    Patient Instructions       If you  have lab work done today you will be contacted with your lab results within the next 2 weeks.  If you have not heard from [place] then please contact us. The fastest way to get your results is to register for My Chart.   IF you received an x-ray today, you will receive an invoice from [facility]. Please contact [facility] at [phone] with questions or concerns regarding your invoice.   IF you received labwork today, you will receive an invoice from [facility]. Please contact [facility] at [phone] with questions or concerns regarding your invoice.   Our billing staff will not be able to assist you with questions regarding bills from these companies.  You will be contacted with the lab results as soon as they are available. The fastest way to get your results is to activate your My Chart account. Instructions are located on the last page of this paperwork. If you have not heard from [place] regarding the results in 2 weeks, please contact this office.     Hypertension, Adult High blood pressure (hypertension) is when the force of blood pumping through the arteries is too strong. The arteries are the blood vessels that carry blood from the heart throughout the body. Hypertension forces the heart to work harder to pump blood and may cause arteries to become narrow or stiff. Untreated or uncontrolled hypertension can cause a heart attack, heart failure, a stroke, kidney disease, and other problems. A blood pressure reading consists of a higher number over a lower number. Ideally, your blood pressure should be below 120/80. The first ("top") number is called the systolic pressure. It is a measure of the pressure in your arteries as your heart beats. The second ("bottom") number is called the diastolic pressure. It is a measure of the pressure in your arteries as the heart relaxes. What are the causes? The exact cause of this condition is not known. There are some conditions that result in  or are related to high blood pressure. What increases the risk? Some risk factors for high blood pressure are under your control. The following factors may make you more likely to develop this condition:  Smoking.  Having type 2 diabetes mellitus, high cholesterol, or both.  Not getting enough exercise or physical activity.  Being overweight.  Having too much fat, sugar, calories, or salt (sodium) in your diet.  Drinking too much alcohol. Some risk factors for high blood pressure may be difficult or impossible to change. Some of these factors include:  Having chronic kidney disease.  Having a family history of high blood pressure.  Age. Risk increases with age.  Race. You may be at higher risk if you are African American.  Gender. Men are at higher risk than women before age 61. After age 66, women are at higher risk than men.  Having obstructive sleep apnea.  Stress. What are the signs or symptoms? High blood pressure may not cause symptoms. Very high blood pressure (hypertensive crisis) may cause:  Headache.  Anxiety.  Shortness of breath.  Nosebleed.  Nausea and vomiting.  Vision changes.  Severe chest pain.  Seizures. How is this diagnosed? This condition is diagnosed by measuring your blood pressure while you are seated, with your  arm resting on a flat surface, your legs uncrossed, and your feet flat on the floor. The cuff of the blood pressure monitor will be placed directly against the skin of your upper arm at the level of your heart. It should be measured at least twice using the same arm. Certain conditions can cause a difference in blood pressure between your right and left arms. Certain factors can cause blood pressure readings to be lower or higher than normal for a short period of time:  When your blood pressure is higher when you are in a health care provider's office than when you are at home, this is called white coat hypertension. Most people with  this condition do not need medicines.  When your blood pressure is higher at home than when you are in a health care provider's office, this is called masked hypertension. Most people with this condition may need medicines to control blood pressure. If you have a high blood pressure reading during one visit or you have normal blood pressure with other risk factors, you may be asked to:  Return on a different day to have your blood pressure checked again.  Monitor your blood pressure at home for 1 week or longer. If you are diagnosed with hypertension, you may have other blood or imaging tests to help your health care provider understand your overall risk for other conditions. How is this treated? This condition is treated by making healthy lifestyle changes, such as eating healthy foods, exercising more, and reducing your alcohol intake. Your health care provider may prescribe medicine if lifestyle changes are not enough to get your blood pressure under control, and if:  Your systolic blood pressure is above 130.  Your diastolic blood pressure is above 80. Your personal target blood pressure may vary depending on your medical conditions, your age, and other factors. Follow these instructions at home: Eating and drinking   Eat a diet that is high in fiber and potassium, and low in sodium, added sugar, and fat. An example eating plan is called the DASH (Dietary Approaches to Stop Hypertension) diet. To eat this way: ? Eat plenty of fresh fruits and vegetables. Try to fill one half of your plate at each meal with fruits and vegetables. ? Eat whole grains, such as whole-wheat pasta, brown rice, or whole-grain bread. Fill about one fourth of your plate with whole grains. ? Eat or drink low-fat dairy products, such as skim milk or low-fat yogurt. ? Avoid fatty cuts of meat, processed or cured meats, and poultry with skin. Fill about one fourth of your plate with lean proteins, such as fish, chicken  without skin, beans, eggs, or tofu. ? Avoid pre-made and processed foods. These tend to be higher in sodium, added sugar, and fat.  Reduce your daily sodium intake. Most people with hypertension should eat less than 1,500 mg of sodium a day.  Do not drink alcohol if: ? Your health care provider tells you not to drink. ? You are pregnant, may be pregnant, or are planning to become pregnant.  If you drink alcohol: ? Limit how much you use to:  0-1 drink a day for women.  0-2 drinks a day for men. ? Be aware of how much alcohol is in your drink. In the [place] one drink equals one 12 oz bottle of beer (355 mL), one 5 oz glass of wine (148 mL), or one 1 oz glass of hard liquor (44 mL). Lifestyle   Work with your  health care provider to maintain a healthy body weight or to lose weight. Ask what an ideal weight is for you.  Get at least 30 minutes of exercise most days of the week. Activities may include walking, swimming, or biking.  Include exercise to strengthen your muscles (resistance exercise), such as Pilates or lifting weights, as part of your weekly exercise routine. Try to do these types of exercises for 30 minutes at least 3 days a week.  Do not use any products that contain nicotine or tobacco, such as cigarettes, e-cigarettes, and chewing tobacco. If you need help quitting, ask your health care provider.  Monitor your blood pressure at home as told by your health care provider.  Keep all follow-up visits as told by your health care provider. This is important. Medicines  Take over-the-counter and prescription medicines only as told by your health care provider. Follow directions carefully. Blood pressure medicines must be taken as prescribed.  Do not skip doses of blood pressure medicine. Doing this puts you at risk for problems and can make the medicine less effective.  Ask your health care provider about side effects or reactions to medicines that you should watch  for. Contact a health care provider if you:  Think you are having a reaction to a medicine you are taking.  Have headaches that keep coming back (recurring).  Feel dizzy.  Have swelling in your ankles.  Have trouble with your vision. Get help right away if you:  Develop a severe headache or confusion.  Have unusual weakness or numbness.  Feel faint.  Have severe pain in your chest or abdomen.  Vomit repeatedly.  Have trouble breathing. Summary  Hypertension is when the force of blood pumping through your arteries is too strong. If this condition is not controlled, it may put you at risk for serious complications.  Your personal target blood pressure may vary depending on your medical conditions, your age, and other factors. For most people, a normal blood pressure is less than 120/80.  Hypertension is treated with lifestyle changes, medicines, or a combination of both. Lifestyle changes include losing weight, eating a healthy, low-sodium diet, exercising more, and limiting alcohol. This information is not intended to replace advice given to you by your health care provider. Make sure you discuss any questions you have with your health care provider. Document Revised: 06/02/2018 Document Reviewed: 06/02/2018 Elsevier Patient Education  2020 [employer]       Agustina Caroli, MD [facility]

## 2020-10-03 NOTE — Patient Instructions (Addendum)
   If you have lab work done today you will be contacted with your lab results within the next 2 weeks.  If you have not heard from us then please contact us. The fastest way to get your results is to register for My Chart.   IF you received an x-ray today, you will receive an invoice from [facility]. Please contact  Radiology at [phone] with questions or concerns regarding your invoice.   IF you received labwork today, you will receive an invoice from [facility]. Please contact [facility] at [phone] with questions or concerns regarding your invoice.   Our billing staff will not be able to assist you with questions regarding bills from these companies.  You will be contacted with the lab results as soon as they are available. The fastest way to get your results is to activate your My Chart account. Instructions are located on the last page of this paperwork. If you have not heard from us regarding the results in 2 weeks, please contact this office.      Hypertension, Adult High blood pressure (hypertension) is when the force of blood pumping through the arteries is too strong. The arteries are the blood vessels that carry blood from the heart throughout the body. Hypertension forces the heart to work harder to pump blood and may cause arteries to become narrow or stiff. Untreated or uncontrolled hypertension can cause a heart attack, heart failure, a stroke, kidney disease, and other problems. A blood pressure reading consists of a higher number over a lower number. Ideally, your blood pressure should be below 120/80. The first ("top") number is called the systolic pressure. It is a measure of the pressure in your arteries as your heart beats. The second ("bottom") number is called the diastolic pressure. It is a measure of the pressure in your arteries as the heart relaxes. What are the causes? The exact cause of this condition is not known. There are some conditions  that result in or are related to high blood pressure. What increases the risk? Some risk factors for high blood pressure are under your control. The following factors may make you more likely to develop this condition:  Smoking.  Having type 2 diabetes mellitus, high cholesterol, or both.  Not getting enough exercise or physical activity.  Being overweight.  Having too much fat, sugar, calories, or salt (sodium) in your diet.  Drinking too much alcohol. Some risk factors for high blood pressure may be difficult or impossible to change. Some of these factors include:  Having chronic kidney disease.  Having a family history of high blood pressure.  Age. Risk increases with age.  Race. You may be at higher risk if you are African American.  Gender. Men are at higher risk than women before age 45. After age 65, women are at higher risk than men.  Having obstructive sleep apnea.  Stress. What are the signs or symptoms? High blood pressure may not cause symptoms. Very high blood pressure (hypertensive crisis) may cause:  Headache.  Anxiety.  Shortness of breath.  Nosebleed.  Nausea and vomiting.  Vision changes.  Severe chest pain.  Seizures. How is this diagnosed? This condition is diagnosed by measuring your blood pressure while you are seated, with your arm resting on a flat surface, your legs uncrossed, and your feet flat on the floor. The cuff of the blood pressure monitor will be placed directly against the skin of your upper arm at the level of your   heart. It should be measured at least twice using the same arm. Certain conditions can cause a difference in blood pressure between your right and left arms. Certain factors can cause blood pressure readings to be lower or higher than normal for a short period of time:  When your blood pressure is higher when you are in a health care provider's office than when you are at home, this is called white coat hypertension.  Most people with this condition do not need medicines.  When your blood pressure is higher at home than when you are in a health care provider's office, this is called masked hypertension. Most people with this condition may need medicines to control blood pressure. If you have a high blood pressure reading during one visit or you have normal blood pressure with other risk factors, you may be asked to:  Return on a different day to have your blood pressure checked again.  Monitor your blood pressure at home for 1 week or longer. If you are diagnosed with hypertension, you may have other blood or imaging tests to help your health care provider understand your overall risk for other conditions. How is this treated? This condition is treated by making healthy lifestyle changes, such as eating healthy foods, exercising more, and reducing your alcohol intake. Your health care provider may prescribe medicine if lifestyle changes are not enough to get your blood pressure under control, and if:  Your systolic blood pressure is above 130.  Your diastolic blood pressure is above 80. Your personal target blood pressure may vary depending on your medical conditions, your age, and other factors. Follow these instructions at home: Eating and drinking   Eat a diet that is high in fiber and potassium, and low in sodium, added sugar, and fat. An example eating plan is called the DASH (Dietary Approaches to Stop Hypertension) diet. To eat this way: ? Eat plenty of fresh fruits and vegetables. Try to fill one half of your plate at each meal with fruits and vegetables. ? Eat whole grains, such as whole-wheat pasta, brown rice, or whole-grain bread. Fill about one fourth of your plate with whole grains. ? Eat or drink low-fat dairy products, such as skim milk or low-fat yogurt. ? Avoid fatty cuts of meat, processed or cured meats, and poultry with skin. Fill about one fourth of your plate with lean proteins, such  as fish, chicken without skin, beans, eggs, or tofu. ? Avoid pre-made and processed foods. These tend to be higher in sodium, added sugar, and fat.  Reduce your daily sodium intake. Most people with hypertension should eat less than 1,500 mg of sodium a day.  Do not drink alcohol if: ? Your health care provider tells you not to drink. ? You are pregnant, may be pregnant, or are planning to become pregnant.  If you drink alcohol: ? Limit how much you use to:  0-1 drink a day for women.  0-2 drinks a day for men. ? Be aware of how much alcohol is in your drink. In the [place] one drink equals one 12 oz bottle of beer (355 mL), one 5 oz glass of wine (148 mL), or one 1 oz glass of hard liquor (44 mL). Lifestyle   Work with your health care provider to maintain a healthy body weight or to lose weight. Ask what an ideal weight is for you.  Get at least 30 minutes of exercise most days of the week. Activities may include walking, swimming,   or biking.  Include exercise to strengthen your muscles (resistance exercise), such as Pilates or lifting weights, as part of your weekly exercise routine. Try to do these types of exercises for 30 minutes at least 3 days a week.  Do not use any products that contain nicotine or tobacco, such as cigarettes, e-cigarettes, and chewing tobacco. If you need help quitting, ask your health care provider.  Monitor your blood pressure at home as told by your health care provider.  Keep all follow-up visits as told by your health care provider. This is important. Medicines  Take over-the-counter and prescription medicines only as told by your health care provider. Follow directions carefully. Blood pressure medicines must be taken as prescribed.  Do not skip doses of blood pressure medicine. Doing this puts you at risk for problems and can make the medicine less effective.  Ask your health care provider about side effects or reactions to medicines that you  should watch for. Contact a health care provider if you:  Think you are having a reaction to a medicine you are taking.  Have headaches that keep coming back (recurring).  Feel dizzy.  Have swelling in your ankles.  Have trouble with your vision. Get help right away if you:  Develop a severe headache or confusion.  Have unusual weakness or numbness.  Feel faint.  Have severe pain in your chest or abdomen.  Vomit repeatedly.  Have trouble breathing. Summary  Hypertension is when the force of blood pumping through your arteries is too strong. If this condition is not controlled, it may put you at risk for serious complications.  Your personal target blood pressure may vary depending on your medical conditions, your age, and other factors. For most people, a normal blood pressure is less than 120/80.  Hypertension is treated with lifestyle changes, medicines, or a combination of both. Lifestyle changes include losing weight, eating a healthy, low-sodium diet, exercising more, and limiting alcohol. This information is not intended to replace advice given to you by your health care provider. Make sure you discuss any questions you have with your health care provider. Document Revised: 06/02/2018 Document Reviewed: 06/02/2018 Elsevier Patient Education  2020 Elsevier Inc.  

## 2020-10-03 NOTE — Assessment & Plan Note (Signed)
Uncontrolled hypertension.  Patient did not take medications today. Continue amlodipine 5 mg daily and increase lisinopril to 20 mg daily. Diet and nutrition discussed. Follow-up in 3 to 6 months.

## 2020-10-04 LAB — RPR+HSVIGM+HBSAG+HSV2(IGG)+...
HIV Screen 4th Generation wRfx: NONREACTIVE
HSV 2 IgG, Type Spec: 7.12 index — ABNORMAL HIGH (ref 0.00–0.90)
HSVI/II Comb IgM: <0.91 Ratio (ref 0.00–0.90)
Hepatitis B Surface Ag: NEGATIVE
RPR Ser Ql: NONREACTIVE

## 2020-10-08 LAB — GC/CHLAMYDIA PROBE AMP (~~LOC~~) NOT AT ARMC
Chlamydia: NEGATIVE
Comment: NEGATIVE
Comment: NORMAL
Neisseria Gonorrhea: NEGATIVE

## 2020-10-12 ENCOUNTER — Encounter: Payer: Self-pay | Admitting: Emergency Medicine

## 2020-10-23 ENCOUNTER — Encounter: Payer: Self-pay | Admitting: Emergency Medicine

## 2020-10-23 ENCOUNTER — Other Ambulatory Visit (HOSPITAL_COMMUNITY)
Admission: RE | Admit: 2020-10-23 | Discharge: 2020-10-23 | Disposition: A | Discharge status: Home / Self Care | Payer: BC Managed Care – PPO | Source: Ambulatory Visit | Attending: Emergency Medicine | Admitting: Emergency Medicine

## 2020-10-23 ENCOUNTER — Other Ambulatory Visit: Payer: Self-pay

## 2020-10-23 ENCOUNTER — Ambulatory Visit: Payer: BC Managed Care – PPO | Admitting: Emergency Medicine

## 2020-10-23 VITALS — BP 135/87 | HR 99 | Temp 98.2°F | Ht 62.0 in | Wt 219.0 lb

## 2020-10-23 DIAGNOSIS — Z202 Contact with and (suspected) exposure to infections with a predominantly sexual mode of transmission: Secondary | ICD-10-CM | POA: Diagnosis not present

## 2020-10-23 DIAGNOSIS — R4582 Worries: Secondary | ICD-10-CM

## 2020-10-23 LAB — POCT URINALYSIS DIP (MANUAL ENTRY)
Bilirubin, UA: NEGATIVE
Blood, UA: NEGATIVE
Glucose, UA: NEGATIVE mg/dL
Ketones, POC UA: NEGATIVE mg/dL
Leukocytes, UA: NEGATIVE
Nitrite, UA: NEGATIVE
Protein Ur, POC: NEGATIVE mg/dL
Spec Grav, UA: <=1.005 — AB (ref 1.010–1.025)
Urobilinogen, UA: 0.2 E.U./dL
pH, UA: 5.5 (ref 5.0–8.0)

## 2020-10-23 NOTE — Progress Notes (Signed)
Daivd Council 35 y.o.   Chief Complaint  Patient presents with  . Exposure to STD    Got tested in November. Partner was pos for tric     HISTORY OF PRESENT ILLNESS: This is a 35 y.o. female exposed to trichomonas 08/20/2020 when she had sexual relations with ex-boyfriend. Asymptomatic still. No other complaints or medical concerns today.  HPI   Prior to Admission medications   Medication Sig Start Date End Date Taking? Authorizing Provider  albuterol (VENTOLIN HFA) 108 (90 Base) MCG/ACT inhaler Inhale 2 puffs into the lungs every 4 (four) hours as needed for wheezing or shortness of breath (cough, shortness of breath or wheezing.). 10/03/20   Horald Pollen, MD  amLODipine (NORVASC) 5 MG tablet Take 1 tablet (5 mg total) by mouth daily. 10/03/20   Horald Pollen, MD  beclomethasone (QVAR) 40 MCG/ACT inhaler Inhale 1 puff into the lungs 2 (two) times daily. 04/02/20   Horald Pollen, MD  Blood Pressure Monitoring (BLOOD PRESSURE KIT) DEVI 1 Units by Does not apply route daily. 12/12/16   Tenna Delaine D, PA-C  busPIRone (BUSPAR) 7.5 MG tablet Take 1 tablet (7.5 mg total) by mouth 2 (two) times daily. 04/02/20   Horald Pollen, MD  cetirizine (ZYRTEC) 10 MG tablet Take 1 tablet (10 mg total) by mouth daily. 10/03/20   Horald Pollen, MD  cyclobenzaprine (FLEXERIL) 5 MG tablet Take 1 tablet (5 mg total) by mouth daily as needed for muscle spasms. 10/03/20   Horald Pollen, MD  dicyclomine (BENTYL) 10 MG capsule Take 1 capsule (10 mg total) by mouth 4 (four) times daily -  before meals and at bedtime for 10 days. 08/16/20 08/26/20  Just, Laurita Quint, FNP  fluticasone (FLONASE) 50 MCG/ACT nasal spray Place 2 sprays into both nostrils daily. 10/03/20   Horald Pollen, MD  hydrOXYzine (ATARAX/VISTARIL) 25 MG tablet TAKE 1 TO 2 TABLETS BY MOUTH THREE TIMES A DAY AS NEEDED 10/03/20   Horald Pollen, MD  ibuprofen (ADVIL) 800 MG tablet Take 1  tablet (800 mg total) by mouth every 6 (six) hours as needed (as needed for pain). 10/03/20   Horald Pollen, MD  levonorgestrel [facility]) 20 MCG/24HR IUD by Intrauterine route.    [provider]  lisinopril (ZESTRIL) 20 MG tablet Take 1 tablet (20 mg total) by mouth daily. 10/03/20   Horald Pollen, MD  mometasone (NASONEX) 50 MCG/ACT nasal spray Place 2 sprays into the nose daily. No longer than 3 weeks Patient not taking: Reported on 10/03/2020 08/16/20   Just, Laurita Quint, FNP  montelukast (SINGULAIR) 10 MG tablet Take 1 tablet (10 mg total) by mouth at bedtime. 10/03/20   Horald Pollen, MD    Allergies  Allergen Reactions  . Effexor [Venlafaxine] Anxiety  . Zoloft [Sertraline Hcl] Anxiety and Other (See Comments)    Patient Active Problem List   Diagnosis Date Noted  . Class 2 severe obesity with serious comorbidity and body mass index (BMI) of 38.0 to 38.9 in adult, unspecified obesity type ([place]) 04/02/2020  . Chronic bilateral low back pain without sciatica 01/03/2019  . Chronic anxiety 01/03/2019  . Essential hypertension 01/03/2019    Past Medical History:  Diagnosis Date  . Allergy   . Anxiety   . Asthma   . Cervical dysplasia   . Depression   . HSV-1 (herpes simplex virus 1) infection   . HSV-2 (herpes simplex virus 2) infection   . Hypertension   .  STD (sexually transmitted disease)    Chlamydia X2,HSV    Past Surgical History:  Procedure Laterality Date  . CERVICAL BIOPSY    . COLPOSCOPY    . GYNECOLOGIC CRYOSURGERY    . Mirena     Inserted 04/07/2018  . WISDOM TOOTH EXTRACTION      Social History   Socioeconomic History  . Marital status: Single    Spouse name: Not on file  . Number of children: 0  . Years of education: Not on file  . Highest education level: Not on file  Occupational History  . Not on file  Tobacco Use  . Smoking status: Former Smoker    Packs/day: 0.25    Years: 4.00    Pack years: 1.00    Types:  Cigarettes  . Smokeless tobacco: Never Used  Vaping Use  . Vaping Use: Never used  Substance and Sexual Activity  . Alcohol use: Yes    Alcohol/week: 8.0 standard drinks    Types: 4 Glasses of wine, 1 Cans of beer, 3 Shots of liquor per week  . Drug use: Yes    Types: Marijuana    Comment: Occas  . Sexual activity: Yes    Birth control/protection: I.U.D.    Comment: Mirena inserted 04/07/2018-1st intercourse 35 yo-More than 5 partners  Other Topics Concern  . Not on file  Social History Narrative   Program supervisor   Social Determinants of Health   Financial Resource Strain: Not on file  Food Insecurity: Not on file  Transportation Needs: Not on file  Physical Activity: Not on file  Stress: Not on file  Social Connections: Not on file  Intimate Partner Violence: Not on file    Family History  Problem Relation Age of Onset  . Hyperlipidemia Mother   . Hypertension Mother   . Hypertension Father   . Heart attack Father   . Mental illness Sister   . Diabetes Maternal Grandmother   . Cancer Maternal Grandmother        Lung  . Cancer Maternal Grandfather        Lung  . Hypertension Maternal Grandfather   . Heart disease Maternal Grandfather   . Mental illness Paternal Grandmother   . Hypertension Paternal Grandfather   . Stroke Paternal Grandfather   . Mental illness Sister      Review of Systems  Constitutional: Negative.  Negative for chills and fever.  HENT: Negative.  Negative for congestion and sore throat.   Respiratory: Negative.  Negative for cough and shortness of breath.   Cardiovascular: Negative.  Negative for chest pain and palpitations.  Gastrointestinal: Negative.  Negative for abdominal pain, diarrhea, nausea and vomiting.  Genitourinary: Negative.  Negative for dysuria and hematuria.  Musculoskeletal: Negative for myalgias and neck pain.  Skin: Negative.  Negative for rash.  Neurological: Negative.  Negative for dizziness and headaches.  All  other systems reviewed and are negative.    Today's Vitals   10/23/20 1353  BP: 135/87  Pulse: 99  Temp: 98.2 F (36.8 C)  TempSrc: Temporal  SpO2: 99%  Weight: 219 lb (99.3 kg)  Height: '5\' 2"'  (1.575 m)   Body mass index is 40.06 kg/m.   Physical Exam Vitals reviewed.  Constitutional:      Appearance: Normal appearance.  HENT:     Head: Normocephalic.  Eyes:     Extraocular Movements: Extraocular movements intact.     Pupils: Pupils are equal, round, and reactive to light.  Cardiovascular:  Rate and Rhythm: Normal rate.  Pulmonary:     Effort: Pulmonary effort is normal.  Abdominal:     Palpations: Abdomen is soft.     Tenderness: There is no abdominal tenderness.  Musculoskeletal:        General: Normal range of motion.     Cervical back: Normal range of motion.  Skin:    General: Skin is warm and dry.  Neurological:     General: No focal deficit present.     Mental Status: She is alert and oriented to person, place, and time.  Psychiatric:        Mood and Affect: Mood normal.        Behavior: Behavior normal.      ASSESSMENT & PLAN: Terea was seen today for exposure to std.  Diagnoses and all orders for this visit:  Worries  Exposure to trichomonas -     GC/Chlamydia probe amp ([place])not at [facility] -     Urine cytology ancillary only -     POCT urinalysis dipstick    Patient Instructions       If you have lab work done today you will be contacted with your lab results within the next 2 weeks.  If you have not heard from [place] then please contact us. The fastest way to get your results is to register for My Chart.   IF you received an x-ray today, you will receive an invoice from [facility]. Please contact [facility] at [phone] with questions or concerns regarding your invoice.   IF you received labwork today, you will receive an invoice from [facility]. Please contact [facility] at [phone] with questions or  concerns regarding your invoice.   Our billing staff will not be able to assist you with questions regarding bills from these companies.  You will be contacted with the lab results as soon as they are available. The fastest way to get your results is to activate your My Chart account. Instructions are located on the last page of this paperwork. If you have not heard from [place] regarding the results in 2 weeks, please contact this office.      Trichomoniasis Trichomoniasis is an STI (sexually transmitted infection) that can affect both women and men. In women, the outer area of the female genitalia (vulva) and the vagina are affected. In men, mainly the penis is affected, but the prostate and other reproductive organs can also be involved.  This condition can be treated with medicine. It often has no symptoms (is asymptomatic), especially in men. If not treated, trichomoniasis can last for months or years. What are the causes? This condition is caused by a parasite called Trichomonas vaginalis. Trichomoniasis most often spreads from person to person (is contagious) through sexual contact. What increases the risk? The following factors may make you more likely to develop this condition:  Having unprotected sex.  Having sex with a partner who has trichomoniasis.  Having multiple sexual partners.  Having had previous trichomoniasis infections or other STIs. What are the signs or symptoms? In women, symptoms of trichomoniasis include:  Abnormal vaginal discharge that is clear, white, gray, or yellow-green and foamy and has an unusual "fishy" odor.  Itching and irritation of the vagina and vulva.  Burning or pain during urination or sex.  Redness and swelling of the genitals. In men, symptoms of trichomoniasis include:  Penile discharge that may be foamy or contain pus.  Pain in the penis. This may happen only when  urinating.  Itching or irritation inside the penis.  Burning after  urination or ejaculation. How is this diagnosed? In women, this condition may be found during a routine Pap test or physical exam. It may be found in men during a routine physical exam. Your health care provider may do tests to help diagnose this infection, such as:  Urine tests (men and women).  The following in women: ? Testing the pH of the vagina. ? A vaginal swab test that checks for the Trichomonas vaginalis parasite. ? Testing vaginal secretions. Your health care provider may test you for other STIs, including HIV (human immunodeficiency virus). How is this treated? This condition is treated with medicine taken by mouth (orally), such as metronidazole or tinidazole, to fight the infection. Your sexual partner(s) also need to be tested and treated.  If you are a woman and you plan to become pregnant or think you may be pregnant, tell your health care provider right away. Some medicines that are used to treat the infection should not be taken during pregnancy. Your health care provider may recommend over-the-counter medicines or creams to help relieve itching or irritation. You may be tested for infection again 3 months after treatment.   Follow these instructions at home:  Take and use over-the-counter and prescription medicines, including creams, only as told by your health care provider.  Take your antibiotic medicine as told by your health care provider. Do not stop taking the antibiotic even if you start to feel better.  Do not have sex until 7-10 days after you finish your medicine, or until your health care provider approves. Ask your health care provider when you may start to have sex again.  (Women) Do not douche or wear tampons while you have the infection.  Discuss your infection with your sexual partner(s). Make sure that your partner gets tested and treated, if necessary.  Keep all follow-up visits as told by your health care provider. This is important. How is this  prevented?  Use condoms every time you have sex. Using condoms correctly and consistently can help protect against STIs.  Avoid having multiple sexual partners.  Talk with your sexual partner about any symptoms that either of you may have, as well as any history of STIs.  Get tested for STIs and STDs (sexually transmitted diseases) before you have sex. Ask your partner to do the same.  Do not have sexual contact if you have symptoms of trichomoniasis or another STI.   Contact a health care provider if:  You still have symptoms after you finish your medicine.  You develop pain in your abdomen.  You have pain when you urinate.  You have bleeding after sex.  You develop a rash.  You feel nauseous or you vomit.  You plan to become pregnant or think you may be pregnant. Summary  Trichomoniasis is an STI (sexually transmitted infection) that can affect both women and men.  This condition often has no symptoms (is asymptomatic), especially in men.  Without treatment, this condition can last for months or years.  You should not have sex until 7-10 days after you finish your medicine, or until your health care provider approves. Ask your health care provider when you may start to have sex again.  Discuss your infection with your sexual partner(s). Make sure that your partner gets tested and treated, if necessary. This information is not intended to replace advice given to you by your health care provider. Make sure you discuss any  questions you have with your health care provider. Document Revised: 07/06/2018 Document Reviewed: 07/06/2018 Elsevier Patient Education  2021 Elsevier Inc.      Agustina Caroli, MD [facility]

## 2020-10-23 NOTE — Patient Instructions (Addendum)
If you have lab work done today you will be contacted with your lab results within the next 2 weeks.  If you have not heard from [place] then please contact us. The fastest way to get your results is to register for My Chart.   IF you received an x-ray today, you will receive an invoice from [facility]. Please contact [facility] at [phone] with questions or concerns regarding your invoice.   IF you received labwork today, you will receive an invoice from [facility]. Please contact [facility] at [phone] with questions or concerns regarding your invoice.   Our billing staff will not be able to assist you with questions regarding bills from these companies.  You will be contacted with the lab results as soon as they are available. The fastest way to get your results is to activate your My Chart account. Instructions are located on the last page of this paperwork. If you have not heard from [place] regarding the results in 2 weeks, please contact this office.      Trichomoniasis Trichomoniasis is an STI (sexually transmitted infection) that can affect both women and men. In women, the outer area of the female genitalia (vulva) and the vagina are affected. In men, mainly the penis is affected, but the prostate and other reproductive organs can also be involved.  This condition can be treated with medicine. It often has no symptoms (is asymptomatic), especially in men. If not treated, trichomoniasis can last for months or years. What are the causes? This condition is caused by a parasite called Trichomonas vaginalis. Trichomoniasis most often spreads from person to person (is contagious) through sexual contact. What increases the risk? The following factors may make you more likely to develop this condition:  Having unprotected sex.  Having sex with a partner who has trichomoniasis.  Having multiple sexual partners.  Having had previous trichomoniasis infections or other  STIs. What are the signs or symptoms? In women, symptoms of trichomoniasis include:  Abnormal vaginal discharge that is clear, white, gray, or yellow-green and foamy and has an unusual "fishy" odor.  Itching and irritation of the vagina and vulva.  Burning or pain during urination or sex.  Redness and swelling of the genitals. In men, symptoms of trichomoniasis include:  Penile discharge that may be foamy or contain pus.  Pain in the penis. This may happen only when urinating.  Itching or irritation inside the penis.  Burning after urination or ejaculation. How is this diagnosed? In women, this condition may be found during a routine Pap test or physical exam. It may be found in men during a routine physical exam. Your health care provider may do tests to help diagnose this infection, such as:  Urine tests (men and women).  The following in women: ? Testing the pH of the vagina. ? A vaginal swab test that checks for the Trichomonas vaginalis parasite. ? Testing vaginal secretions. Your health care provider may test you for other STIs, including HIV (human immunodeficiency virus). How is this treated? This condition is treated with medicine taken by mouth (orally), such as metronidazole or tinidazole, to fight the infection. Your sexual partner(s) also need to be tested and treated.  If you are a woman and you plan to become pregnant or think you may be pregnant, tell your health care provider right away. Some medicines that are used to treat the infection should not be taken during pregnancy. Your health care provider may recommend over-the-counter medicines or  creams to help relieve itching or irritation. You may be tested for infection again 3 months after treatment.   Follow these instructions at home:  Take and use over-the-counter and prescription medicines, including creams, only as told by your health care provider.  Take your antibiotic medicine as told by your health  care provider. Do not stop taking the antibiotic even if you start to feel better.  Do not have sex until 7-10 days after you finish your medicine, or until your health care provider approves. Ask your health care provider when you may start to have sex again.  (Women) Do not douche or wear tampons while you have the infection.  Discuss your infection with your sexual partner(s). Make sure that your partner gets tested and treated, if necessary.  Keep all follow-up visits as told by your health care provider. This is important. How is this prevented?  Use condoms every time you have sex. Using condoms correctly and consistently can help protect against STIs.  Avoid having multiple sexual partners.  Talk with your sexual partner about any symptoms that either of you may have, as well as any history of STIs.  Get tested for STIs and STDs (sexually transmitted diseases) before you have sex. Ask your partner to do the same.  Do not have sexual contact if you have symptoms of trichomoniasis or another STI.   Contact a health care provider if:  You still have symptoms after you finish your medicine.  You develop pain in your abdomen.  You have pain when you urinate.  You have bleeding after sex.  You develop a rash.  You feel nauseous or you vomit.  You plan to become pregnant or think you may be pregnant. Summary  Trichomoniasis is an STI (sexually transmitted infection) that can affect both women and men.  This condition often has no symptoms (is asymptomatic), especially in men.  Without treatment, this condition can last for months or years.  You should not have sex until 7-10 days after you finish your medicine, or until your health care provider approves. Ask your health care provider when you may start to have sex again.  Discuss your infection with your sexual partner(s). Make sure that your partner gets tested and treated, if necessary. This information is not  intended to replace advice given to you by your health care provider. Make sure you discuss any questions you have with your health care provider. Document Revised: 07/06/2018 Document Reviewed: 07/06/2018 Elsevier Patient Education  2021 [employer].

## 2020-10-26 ENCOUNTER — Telehealth: Payer: Self-pay | Admitting: Emergency Medicine

## 2020-10-26 LAB — URINE CYTOLOGY ANCILLARY ONLY
Chlamydia: NEGATIVE
Comment: NEGATIVE
Comment: NEGATIVE
Comment: NORMAL
Neisseria Gonorrhea: NEGATIVE
Trichomonas: NEGATIVE

## 2020-10-26 NOTE — Telephone Encounter (Signed)
Wait on sagardias comment then call pt

## 2020-10-26 NOTE — Telephone Encounter (Signed)
Pt would like a cb concerning her most recent lab results. Please advise at [phone].

## 2020-11-14 ENCOUNTER — Encounter: Payer: Self-pay | Admitting: Emergency Medicine

## 2020-11-15 NOTE — Telephone Encounter (Signed)
She should not be on chronic muscle relaxers.  Only as needed doses at bedtime if at all.  Thanks.

## 2020-11-21 ENCOUNTER — Telehealth: Payer: Self-pay | Admitting: *Deleted

## 2020-11-21 ENCOUNTER — Encounter: Payer: Self-pay | Admitting: Emergency Medicine

## 2020-11-21 ENCOUNTER — Ambulatory Visit: Payer: BC Managed Care – PPO | Admitting: Emergency Medicine

## 2020-11-21 ENCOUNTER — Other Ambulatory Visit: Payer: Self-pay

## 2020-11-21 VITALS — BP 130/80 | HR 103 | Temp 97.4°F | Resp 16 | Ht 62.75 in | Wt 215.0 lb

## 2020-11-21 DIAGNOSIS — Z6838 Body mass index (BMI) 38.0-38.9, adult: Secondary | ICD-10-CM

## 2020-11-21 DIAGNOSIS — F419 Anxiety disorder, unspecified: Secondary | ICD-10-CM | POA: Diagnosis not present

## 2020-11-21 DIAGNOSIS — M545 Low back pain, unspecified: Secondary | ICD-10-CM

## 2020-11-21 DIAGNOSIS — G8929 Other chronic pain: Secondary | ICD-10-CM | POA: Diagnosis not present

## 2020-11-21 MED ORDER — HYDROXYZINE HCL 25 MG PO TABS
25.0000 mg | ORAL_TABLET | Freq: Three times a day (TID) | ORAL | 1 refills | Status: DC | PRN
Start: 2020-11-21 — End: 2021-05-14

## 2020-11-21 MED ORDER — CYCLOBENZAPRINE HCL 5 MG PO TABS: 5.0000 mg | ORAL_TABLET | Freq: Every day | ORAL | 1 refills | Status: DC | PRN

## 2020-11-21 NOTE — Patient Instructions (Addendum)
   If you have lab work done today you will be contacted with your lab results within the next 2 weeks.  If you have not heard from us then please contact us. The fastest way to get your results is to register for My Chart.   IF you received an x-ray today, you will receive an invoice from [facility]. Please contact [facility] at [phone] with questions or concerns regarding your invoice.   IF you received labwork today, you will receive an invoice from [facility]. Please contact [facility] at [phone] with questions or concerns regarding your invoice.   Our billing staff will not be able to assist you with questions regarding bills from these companies.  You will be contacted with the lab results as soon as they are available. The fastest way to get your results is to activate your My Chart account. Instructions are located on the last page of this paperwork. If you have not heard from us regarding the results in 2 weeks, please contact this office.     Health Maintenance, Female Adopting a healthy lifestyle and getting preventive care are important in promoting health and wellness. Ask your health care provider about:  The right schedule for you to have regular tests and exams.  Things you can do on your own to prevent diseases and keep yourself healthy. What should I know about diet, weight, and exercise? Eat a healthy diet  Eat a diet that includes plenty of vegetables, fruits, low-fat dairy products, and lean protein.  Do not eat a lot of foods that are high in solid fats, added sugars, or sodium.   Maintain a healthy weight Body mass index (BMI) is used to identify weight problems. It estimates body fat based on height and weight. Your health care provider can help determine your BMI and help you achieve or maintain a healthy weight. Get regular exercise Get regular exercise. This is one of the most important things you can do for your health. Most  adults should:  Exercise for at least 150 minutes each week. The exercise should increase your heart rate and make you sweat (moderate-intensity exercise).  Do strengthening exercises at least twice a week. This is in addition to the moderate-intensity exercise.  Spend less time sitting. Even light physical activity can be beneficial. Watch cholesterol and blood lipids Have your blood tested for lipids and cholesterol at 35 years of age, then have this test every 5 years. Have your cholesterol levels checked more often if:  Your lipid or cholesterol levels are high.  You are older than 35 years of age.  You are at high risk for heart disease. What should I know about cancer screening? Depending on your health history and family history, you may need to have cancer screening at various ages. This may include screening for:  Breast cancer.  Cervical cancer.  Colorectal cancer.  Skin cancer.  Lung cancer. What should I know about heart disease, diabetes, and high blood pressure? Blood pressure and heart disease  High blood pressure causes heart disease and increases the risk of stroke. This is more likely to develop in people who have high blood pressure readings, are of African descent, or are overweight.  Have your blood pressure checked: ? Every 3-5 years if you are 18-39 years of age. ? Every year if you are 40 years old or older. Diabetes Have regular diabetes screenings. This checks your fasting blood sugar level. Have the screening done:  Once every   three years after age 40 if you are at a normal weight and have a low risk for diabetes.  More often and at a younger age if you are overweight or have a high risk for diabetes. What should I know about preventing infection? Hepatitis B If you have a higher risk for hepatitis B, you should be screened for this virus. Talk with your health care provider to find out if you are at risk for hepatitis B infection. Hepatitis  C Testing is recommended for:  Everyone born from 1945 through 1965.  Anyone with known risk factors for hepatitis C. Sexually transmitted infections (STIs)  Get screened for STIs, including gonorrhea and chlamydia, if: ? You are sexually active and are younger than 35 years of age. ? You are older than 35 years of age and your health care provider tells you that you are at risk for this type of infection. ? Your sexual activity has changed since you were last screened, and you are at increased risk for chlamydia or gonorrhea. Ask your health care provider if you are at risk.  Ask your health care provider about whether you are at high risk for HIV. Your health care provider may recommend a prescription medicine to help prevent HIV infection. If you choose to take medicine to prevent HIV, you should first get tested for HIV. You should then be tested every 3 months for as long as you are taking the medicine. Pregnancy  If you are about to stop having your period (premenopausal) and you may become pregnant, seek counseling before you get pregnant.  Take 400 to 800 micrograms (mcg) of folic acid every day if you become pregnant.  Ask for birth control (contraception) if you want to prevent pregnancy. Osteoporosis and menopause Osteoporosis is a disease in which the bones lose minerals and strength with aging. This can result in bone fractures. If you are 65 years old or older, or if you are at risk for osteoporosis and fractures, ask your health care provider if you should:  Be screened for bone loss.  Take a calcium or vitamin D supplement to lower your risk of fractures.  Be given hormone replacement therapy (HRT) to treat symptoms of menopause. Follow these instructions at home: Lifestyle  Do not use any products that contain nicotine or tobacco, such as cigarettes, e-cigarettes, and chewing tobacco. If you need help quitting, ask your health care provider.  Do not use street  drugs.  Do not share needles.  Ask your health care provider for help if you need support or information about quitting drugs. Alcohol use  Do not drink alcohol if: ? Your health care provider tells you not to drink. ? You are pregnant, may be pregnant, or are planning to become pregnant.  If you drink alcohol: ? Limit how much you use to 0-1 drink a day. ? Limit intake if you are breastfeeding.  Be aware of how much alcohol is in your drink. In the [place] one drink equals one 12 oz bottle of beer (355 mL), one 5 oz glass of wine (148 mL), or one 1 oz glass of hard liquor (44 mL). General instructions  Schedule regular health, dental, and eye exams.  Stay current with your vaccines.  Tell your health care provider if: ? You often feel depressed. ? You have ever been abused or do not feel safe at home. Summary  Adopting a healthy lifestyle and getting preventive care are important in promoting health and wellness.    Follow your health care provider's instructions about healthy diet, exercising, and getting tested or screened for diseases.  Follow your health care provider's instructions on monitoring your cholesterol and blood pressure. This information is not intended to replace advice given to you by your health care provider. Make sure you discuss any questions you have with your health care provider. Document Revised: 09/15/2018 Document Reviewed: 09/15/2018 Elsevier Patient Education  2021 [employer]  

## 2020-11-21 NOTE — Telephone Encounter (Signed)
Patient was here for office visit today and needed forms to be completed for employment ([employer]) accommodation. Dr Mitchel Honour completed the forms and patient received her copy. Another copy to scan and to [facility].

## 2020-11-21 NOTE — Progress Notes (Signed)
Alexis Donaldson 35 y.o.   Chief Complaint  Patient presents with  . Other    Per patient to get job accommodation paperwork to complete    HISTORY OF PRESENT ILLNESS: This is a 35 y.o. female requesting paperwork for accommodations at work be filled out. Accommodations based on diagnosis of chronic intermittent lumbar pain that requires sporadic use of Flexeril 5 mg. Usually takes it at bedtime as needed but sometimes during the day as well. Also has history of chronic anxiety disorder. No other complaints or medical concerns today. Fully vaccinated against COVID with a booster.  HPI   Prior to Admission medications   Medication Sig Start Date End Date Taking? Authorizing Provider  albuterol (VENTOLIN HFA) 108 (90 Base) MCG/ACT inhaler Inhale 2 puffs into the lungs every 4 (four) hours as needed for wheezing or shortness of breath (cough, shortness of breath or wheezing.). 10/03/20  Yes Boston Catarino, Ines Bloomer, MD  amLODipine (NORVASC) 5 MG tablet Take 1 tablet (5 mg total) by mouth daily. 10/03/20  Yes Dave Mannes, Ines Bloomer, MD  beclomethasone (QVAR) 40 MCG/ACT inhaler Inhale 1 puff into the lungs 2 (two) times daily. 04/02/20  Yes Naima Veldhuizen, Ines Bloomer, MD  busPIRone (BUSPAR) 7.5 MG tablet Take 1 tablet (7.5 mg total) by mouth 2 (two) times daily. 04/02/20  Yes Luceil Herrin, Ines Bloomer, MD  cetirizine (ZYRTEC) 10 MG tablet Take 1 tablet (10 mg total) by mouth daily. 10/03/20  Yes Ahad Colarusso, Ines Bloomer, MD  cyclobenzaprine (FLEXERIL) 5 MG tablet Take 1 tablet (5 mg total) by mouth daily as needed for muscle spasms. 10/03/20  Yes Osker Ayoub, Ines Bloomer, MD  fluticasone [facility]) 50 MCG/ACT nasal spray Place 2 sprays into both nostrils daily. 10/03/20  Yes Rhonin Trott, Ines Bloomer, MD  hydrOXYzine (ATARAX/VISTARIL) 25 MG tablet TAKE 1 TO 2 TABLETS BY MOUTH THREE TIMES A DAY AS NEEDED 10/03/20  Yes Jaheim Canino, Ines Bloomer, MD  ibuprofen (ADVIL) 800 MG tablet Take 1 tablet (800 mg total) by mouth every 6  (six) hours as needed (as needed for pain). 10/03/20  Yes Lonnetta Kniskern, Ines Bloomer, MD  lisinopril (ZESTRIL) 20 MG tablet Take 1 tablet (20 mg total) by mouth daily. 10/03/20  Yes Griffith Santilli, Ines Bloomer, MD  montelukast (SINGULAIR) 10 MG tablet Take 1 tablet (10 mg total) by mouth at bedtime. 10/03/20  Yes Arsal Tappan, Ines Bloomer, MD  Blood Pressure Monitoring (BLOOD PRESSURE KIT) DEVI 1 Units by Does not apply route daily. 12/12/16   Tenna Delaine D, PA-C  levonorgestrel (MIRENA) 20 MCG/24HR IUD by Intrauterine route.    [provider]    Allergies  Allergen Reactions  . Effexor [Venlafaxine] Anxiety  . Zoloft [Sertraline Hcl] Anxiety and Other (See Comments)    Patient Active Problem List   Diagnosis Date Noted  . Class 2 severe obesity with serious comorbidity and body mass index (BMI) of 38.0 to 38.9 in adult, unspecified obesity type ([place]) 04/02/2020  . Chronic bilateral low back pain without sciatica 01/03/2019  . Chronic anxiety 01/03/2019  . Essential hypertension 01/03/2019    Past Medical History:  Diagnosis Date  . Allergy   . Anxiety   . Asthma   . Cervical dysplasia   . Depression   . HSV-1 (herpes simplex virus 1) infection   . HSV-2 (herpes simplex virus 2) infection   . Hypertension   . STD (sexually transmitted disease)    Chlamydia X2,HSV    Past Surgical History:  Procedure Laterality Date  . CERVICAL BIOPSY    .  COLPOSCOPY    . GYNECOLOGIC CRYOSURGERY    . Mirena     Inserted 04/07/2018  . WISDOM TOOTH EXTRACTION      Social History   Socioeconomic History  . Marital status: Single    Spouse name: Not on file  . Number of children: 0  . Years of education: Not on file  . Highest education level: Not on file  Occupational History  . Not on file  Tobacco Use  . Smoking status: Former Smoker    Packs/day: 0.25    Years: 4.00    Pack years: 1.00    Types: Cigarettes  . Smokeless tobacco: Never Used  Vaping Use  . Vaping Use: Never  used  Substance and Sexual Activity  . Alcohol use: Yes    Alcohol/week: 8.0 standard drinks    Types: 4 Glasses of wine, 1 Cans of beer, 3 Shots of liquor per week  . Drug use: Yes    Types: Marijuana    Comment: Occas  . Sexual activity: Yes    Birth control/protection: I.U.D.    Comment: Mirena inserted 04/07/2018-1st intercourse 35 yo-More than 5 partners  Other Topics Concern  . Not on file  Social History Narrative   Program supervisor   Social Determinants of Health   Financial Resource Strain: Not on file  Food Insecurity: Not on file  Transportation Needs: Not on file  Physical Activity: Not on file  Stress: Not on file  Social Connections: Not on file  Intimate Partner Violence: Not on file    Family History  Problem Relation Age of Onset  . Hyperlipidemia Mother   . Hypertension Mother   . Hypertension Father   . Heart attack Father   . Mental illness Sister   . Diabetes Maternal Grandmother   . Cancer Maternal Grandmother        Lung  . Cancer Maternal Grandfather        Lung  . Hypertension Maternal Grandfather   . Heart disease Maternal Grandfather   . Mental illness Paternal Grandmother   . Hypertension Paternal Grandfather   . Stroke Paternal Grandfather   . Mental illness Sister      Review of Systems  Constitutional: Negative.  Negative for chills and fever.  HENT: Negative.  Negative for congestion and sore throat.   Respiratory: Negative.  Negative for cough and shortness of breath.   Cardiovascular: Negative.  Negative for chest pain and palpitations.  Gastrointestinal: Negative.  Negative for abdominal pain, diarrhea, nausea and vomiting.  Genitourinary: Negative.  Negative for dysuria and hematuria.  Musculoskeletal: Positive for back pain.  Skin: Negative.  Negative for rash.  Neurological: Negative.  Negative for dizziness and headaches.  Psychiatric/Behavioral: The patient is nervous/anxious.   All other systems reviewed and are  negative.    Physical Exam Vitals reviewed.  Constitutional:      Appearance: Normal appearance. She is obese.  HENT:     Head: Normocephalic.  Eyes:     Extraocular Movements: Extraocular movements intact.     Pupils: Pupils are equal, round, and reactive to light.  Cardiovascular:     Rate and Rhythm: Normal rate.  Pulmonary:     Effort: Pulmonary effort is normal.  Musculoskeletal:        General: Normal range of motion.     Cervical back: Normal range of motion.  Skin:    General: Skin is warm and dry.     Capillary Refill: Capillary refill takes less than  2 seconds.  Neurological:     General: No focal deficit present.     Mental Status: She is alert and oriented to person, place, and time.  Psychiatric:        Mood and Affect: Mood normal.        Behavior: Behavior normal.    A total of 30 minutes was spent with the patient, greater than 50% of which was in counseling/coordination of care regarding multiple chronic medical problems, review of all medications, review of most recent office visit notes, job accommodations paperwork, health maintenance items, prognosis, possible need of orthopedic evaluation, documentation and need for follow-up.   ASSESSMENT & PLAN: Alexis Donaldson was seen today for other and medication refill.  Diagnoses and all orders for this visit:  Chronic bilateral low back pain without sciatica -     cyclobenzaprine (FLEXERIL) 5 MG tablet; Take 1 tablet (5 mg total) by mouth daily as needed for muscle spasms.  Chronic anxiety -     hydrOXYzine (ATARAX/VISTARIL) 25 MG tablet; Take 1 tablet (25 mg total) by mouth every 8 (eight) hours as needed. TAKE 1 TO 2 TABLETS BY MOUTH THREE TIMES A DAY AS NEEDED  Class 2 severe obesity with serious comorbidity and body mass index (BMI) of 38.0 to 38.9 in adult, unspecified obesity type [facility])    Patient Instructions       If you have lab work done today you will be contacted with your lab results within the  next 2 weeks.  If you have not heard from [place] then please contact us. The fastest way to get your results is to register for My Chart.   IF you received an x-ray today, you will receive an invoice from [facility]. Please contact [facility] at [phone] with questions or concerns regarding your invoice.   IF you received labwork today, you will receive an invoice from [place]. Please contact [facility] at [phone] with questions or concerns regarding your invoice.   Our billing staff will not be able to assist you with questions regarding bills from these companies.  You will be contacted with the lab results as soon as they are available. The fastest way to get your results is to activate your My Chart account. Instructions are located on the last page of this paperwork. If you have not heard from [place] regarding the results in 2 weeks, please contact this office.     Health Maintenance, Female Adopting a healthy lifestyle and getting preventive care are important in promoting health and wellness. Ask your health care provider about:  The right schedule for you to have regular tests and exams.  Things you can do on your own to prevent diseases and keep yourself healthy. What should I know about diet, weight, and exercise? Eat a healthy diet  Eat a diet that includes plenty of vegetables, fruits, low-fat dairy products, and lean protein.  Do not eat a lot of foods that are high in solid fats, added sugars, or sodium.   Maintain a healthy weight Body mass index (BMI) is used to identify weight problems. It estimates body fat based on height and weight. Your health care provider can help determine your BMI and help you achieve or maintain a healthy weight. Get regular exercise Get regular exercise. This is one of the most important things you can do for your health. Most adults should:  Exercise for at least 150 minutes each week. The exercise should increase your  heart rate and make  you sweat (moderate-intensity exercise).  Do strengthening exercises at least twice a week. This is in addition to the moderate-intensity exercise.  Spend less time sitting. Even light physical activity can be beneficial. Watch cholesterol and blood lipids Have your blood tested for lipids and cholesterol at 35 years of age, then have this test every 5 years. Have your cholesterol levels checked more often if:  Your lipid or cholesterol levels are high.  You are older than 35 years of age.  You are at high risk for heart disease. What should I know about cancer screening? Depending on your health history and family history, you may need to have cancer screening at various ages. This may include screening for:  Breast cancer.  Cervical cancer.  Colorectal cancer.  Skin cancer.  Lung cancer. What should I know about heart disease, diabetes, and high blood pressure? Blood pressure and heart disease  High blood pressure causes heart disease and increases the risk of stroke. This is more likely to develop in people who have high blood pressure readings, are of African descent, or are overweight.  Have your blood pressure checked: ? Every 3-5 years if you are 82-93 years of age. ? Every year if you are 46 years old or older. Diabetes Have regular diabetes screenings. This checks your fasting blood sugar level. Have the screening done:  Once every three years after age 64 if you are at a normal weight and have a low risk for diabetes.  More often and at a younger age if you are overweight or have a high risk for diabetes. What should I know about preventing infection? Hepatitis B If you have a higher risk for hepatitis B, you should be screened for this virus. Talk with your health care provider to find out if you are at risk for hepatitis B infection. Hepatitis C Testing is recommended for:  Everyone born from [birth year] through 1965.  Anyone with known risk  factors for hepatitis C. Sexually transmitted infections (STIs)  Get screened for STIs, including gonorrhea and chlamydia, if: ? You are sexually active and are younger than 35 years of age. ? You are older than 35 years of age and your health care provider tells you that you are at risk for this type of infection. ? Your sexual activity has changed since you were last screened, and you are at increased risk for chlamydia or gonorrhea. Ask your health care provider if you are at risk.  Ask your health care provider about whether you are at high risk for HIV. Your health care provider may recommend a prescription medicine to help prevent HIV infection. If you choose to take medicine to prevent HIV, you should first get tested for HIV. You should then be tested every 3 months for as long as you are taking the medicine. Pregnancy  If you are about to stop having your period (premenopausal) and you may become pregnant, seek counseling before you get pregnant.  Take 400 to 800 micrograms (mcg) of folic acid every day if you become pregnant.  Ask for birth control (contraception) if you want to prevent pregnancy. Osteoporosis and menopause Osteoporosis is a disease in which the bones lose minerals and strength with aging. This can result in bone fractures. If you are 53 years old or older, or if you are at risk for osteoporosis and fractures, ask your health care provider if you should:  Be screened for bone loss.  Take a calcium or vitamin D supplement to  lower your risk of fractures.  Be given hormone replacement therapy (HRT) to treat symptoms of menopause. Follow these instructions at home: Lifestyle  Do not use any products that contain nicotine or tobacco, such as cigarettes, e-cigarettes, and chewing tobacco. If you need help quitting, ask your health care provider.  Do not use street drugs.  Do not share needles.  Ask your health care provider for help if you need support or  information about quitting drugs. Alcohol use  Do not drink alcohol if: ? Your health care provider tells you not to drink. ? You are pregnant, may be pregnant, or are planning to become pregnant.  If you drink alcohol: ? Limit how much you use to 0-1 drink a day. ? Limit intake if you are breastfeeding.  Be aware of how much alcohol is in your drink. In the [place] one drink equals one 12 oz bottle of beer (355 mL), one 5 oz glass of wine (148 mL), or one 1 oz glass of hard liquor (44 mL). General instructions  Schedule regular health, dental, and eye exams.  Stay current with your vaccines.  Tell your health care provider if: ? You often feel depressed. ? You have ever been abused or do not feel safe at home. Summary  Adopting a healthy lifestyle and getting preventive care are important in promoting health and wellness.  Follow your health care provider's instructions about healthy diet, exercising, and getting tested or screened for diseases.  Follow your health care provider's instructions on monitoring your cholesterol and blood pressure. This information is not intended to replace advice given to you by your health care provider. Make sure you discuss any questions you have with your health care provider. Document Revised: 09/15/2018 Document Reviewed: 09/15/2018 Elsevier Patient Education  2021 Elsevier Inc.      Agustina Caroli, MD [facility]

## 2021-01-01 ENCOUNTER — Ambulatory Visit: Payer: BC Managed Care – PPO | Admitting: Emergency Medicine

## 2021-03-28 DIAGNOSIS — M62830 Muscle spasm of back: Secondary | ICD-10-CM | POA: Diagnosis not present

## 2021-03-28 DIAGNOSIS — S39012A Strain of muscle, fascia and tendon of lower back, initial encounter: Secondary | ICD-10-CM | POA: Diagnosis not present

## 2021-05-14 ENCOUNTER — Other Ambulatory Visit: Payer: Self-pay

## 2021-05-14 ENCOUNTER — Ambulatory Visit: Payer: BC Managed Care – PPO | Admitting: Nurse Practitioner

## 2021-05-14 ENCOUNTER — Other Ambulatory Visit (HOSPITAL_COMMUNITY)
Admission: RE | Admit: 2021-05-14 | Discharge: 2021-05-14 | Disposition: A | Discharge status: Home / Self Care | Payer: BC Managed Care – PPO | Source: Ambulatory Visit | Attending: Nurse Practitioner | Admitting: Nurse Practitioner

## 2021-05-14 ENCOUNTER — Encounter: Payer: Self-pay | Admitting: Nurse Practitioner

## 2021-05-14 VITALS — BP 124/83 | HR 92 | Temp 97.6°F | Ht 63.0 in | Wt 225.0 lb

## 2021-05-14 DIAGNOSIS — Z202 Contact with and (suspected) exposure to infections with a predominantly sexual mode of transmission: Secondary | ICD-10-CM | POA: Diagnosis not present

## 2021-05-14 DIAGNOSIS — R300 Dysuria: Secondary | ICD-10-CM | POA: Diagnosis not present

## 2021-05-14 DIAGNOSIS — I10 Essential (primary) hypertension: Secondary | ICD-10-CM | POA: Diagnosis not present

## 2021-05-14 DIAGNOSIS — F411 Generalized anxiety disorder: Secondary | ICD-10-CM

## 2021-05-14 DIAGNOSIS — G8929 Other chronic pain: Secondary | ICD-10-CM

## 2021-05-14 DIAGNOSIS — J309 Allergic rhinitis, unspecified: Secondary | ICD-10-CM | POA: Diagnosis not present

## 2021-05-14 DIAGNOSIS — M545 Low back pain, unspecified: Secondary | ICD-10-CM

## 2021-05-14 DIAGNOSIS — F419 Anxiety disorder, unspecified: Secondary | ICD-10-CM

## 2021-05-14 LAB — URINALYSIS, ROUTINE W REFLEX MICROSCOPIC
Bilirubin, UA: NEGATIVE
Glucose, UA: NEGATIVE
Ketones, UA: NEGATIVE
Leukocytes,UA: NEGATIVE
Nitrite, UA: NEGATIVE
Protein,UA: NEGATIVE
RBC, UA: NEGATIVE
Specific Gravity, UA: 1.02 (ref 1.005–1.030)
Urobilinogen, Ur: 2 mg/dL — ABNORMAL HIGH (ref 0.2–1.0)
pH, UA: 7.5 (ref 5.0–7.5)

## 2021-05-14 MED ORDER — CETIRIZINE HCL 10 MG PO TABS: 10.0000 mg | ORAL_TABLET | Freq: Every day | ORAL | 1 refills | Status: AC

## 2021-05-14 MED ORDER — CYCLOBENZAPRINE HCL 5 MG PO TABS: 5.0000 mg | ORAL_TABLET | Freq: Every day | ORAL | 1 refills | Status: DC | PRN

## 2021-05-14 MED ORDER — LISINOPRIL 20 MG PO TABS: 20.0000 mg | ORAL_TABLET | Freq: Every day | ORAL | 3 refills | Status: AC

## 2021-05-14 MED ORDER — FLUTICASONE PROPIONATE 50 MCG/ACT NA SUSP: 2.0000 | Freq: Every day | NASAL | 3 refills | Status: AC

## 2021-05-14 MED ORDER — HYDROXYZINE HCL 25 MG PO TABS: 25.0000 mg | ORAL_TABLET | Freq: Three times a day (TID) | ORAL | 1 refills | Status: DC | PRN

## 2021-05-14 MED ORDER — MONTELUKAST SODIUM 10 MG PO TABS: 10.0000 mg | ORAL_TABLET | Freq: Every day | ORAL | 3 refills | Status: DC

## 2021-05-14 MED ORDER — AMLODIPINE BESYLATE 5 MG PO TABS: 5.0000 mg | ORAL_TABLET | Freq: Every day | ORAL | 3 refills | Status: DC

## 2021-05-14 MED ORDER — BUSPIRONE HCL 7.5 MG PO TABS: 7.5000 mg | ORAL_TABLET | Freq: Two times a day (BID) | ORAL | 1 refills | Status: DC

## 2021-05-14 NOTE — Assessment & Plan Note (Signed)
Complaint of dysuria, and flank pain on assessment and CVA tenderness.  Completed urinalysis results pending.  Increase hydration, Will treat and follow-up with worsening unresolved symptoms.

## 2021-05-14 NOTE — Assessment & Plan Note (Signed)
Hypertension well-controlled.  No changes to current medication.  Rx refill sent to pharmacy.  Continue healthy diet low in sodium and exercise at least 3 times a week.

## 2021-05-14 NOTE — Progress Notes (Signed)
Acute Office Visit  Subjective:    Patient ID: Alexis Donaldson, female    DOB: 1985/11/02, 35 y.o.   MRN: [id number]  Chief Complaint  Patient presents with   Back Pain    Back Pain This is a new problem. The current episode started in the past 7 days. The problem occurs constantly. The problem has been gradually worsening since onset. The quality of the pain is described as aching. The pain is at a severity of 5/10. The pain is moderate. The pain is Worse during the day. The symptoms are aggravated by bending. Stiffness is present All day. Risk factors include obesity. She has tried nothing for the symptoms. The treatment provided mild relief.    Patient is also reporting exposure to STD, unprotected sex and alternative lifestyle.  Patient has no current symptoms and she makes to the practice to check for STDs every 4 months.  Patient presents for follow up of hypertension. Patient was diagnosed in 01/03/2019. The patient is tolerating the medication well without side effects. Compliance with treatment has been good; including taking medication as directed , maintains a healthy diet and regular exercise regimen , and following up as directed.  Current medication lisinopril 20 mg tablet by mouth daily, amlodipine 5 mg tablet by mouth daily.  Past Medical History:  Diagnosis Date   Allergy    Anxiety    Asthma    Cervical dysplasia    Depression    HSV-1 (herpes simplex virus 1) infection    HSV-2 (herpes simplex virus 2) infection    Hypertension    STD (sexually transmitted disease)    Chlamydia X2,HSV    Past Surgical History:  Procedure Laterality Date   CERVICAL BIOPSY     COLPOSCOPY     GYNECOLOGIC CRYOSURGERY     Mirena     Inserted 04/07/2018   WISDOM TOOTH EXTRACTION      Family History  Problem Relation Age of Onset   Hyperlipidemia Mother    Hypertension Mother    Hypertension Father    Heart attack Father    Mental illness Sister    Diabetes Maternal  Grandmother    Cancer Maternal Grandmother        Lung   Cancer Maternal Grandfather        Lung   Hypertension Maternal Grandfather    Heart disease Maternal Grandfather    Mental illness Paternal Grandmother    Hypertension Paternal Grandfather    Stroke Paternal Grandfather    Mental illness Sister     Social History   Socioeconomic History   Marital status: Single    Spouse name: Not on file   Number of children: 0   Years of education: Not on file   Highest education level: Not on file  Occupational History   Not on file  Tobacco Use   Smoking status: Former    Packs/day: 0.25    Years: 4.00    Pack years: 1.00    Types: Cigarettes   Smokeless tobacco: Never  Vaping Use   Vaping Use: Never used  Substance and Sexual Activity   Alcohol use: Yes    Alcohol/week: 8.0 standard drinks    Types: 4 Glasses of wine, 1 Cans of beer, 3 Shots of liquor per week   Drug use: Yes    Types: Marijuana    Comment: Occas   Sexual activity: Yes    Birth control/protection: I.U.D.    Comment: Mirena inserted 04/07/2018-1st intercourse 35  yo-More than 5 partners  Other Topics Concern   Not on file  Social History Narrative   Software engineer   Social Determinants of Health   Financial Resource Strain: Not on file  Food Insecurity: Not on file  Transportation Needs: Not on file  Physical Activity: Not on file  Stress: Not on file  Social Connections: Not on file  Intimate Partner Violence: Not on file    Outpatient Medications Prior to Visit  Medication Sig Dispense Refill   albuterol (VENTOLIN HFA) 108 (90 Base) MCG/ACT inhaler Inhale 2 puffs into the lungs every 4 (four) hours as needed for wheezing or shortness of breath (cough, shortness of breath or wheezing.). 18 g 1   beclomethasone (QVAR) 40 MCG/ACT inhaler Inhale 1 puff into the lungs 2 (two) times daily. 1 Inhaler 1   Blood Pressure Monitoring (BLOOD PRESSURE KIT) DEVI 1 Units by Does not apply route daily. 1  Device 0   ibuprofen (ADVIL) 800 MG tablet Take 1 tablet (800 mg total) by mouth every 6 (six) hours as needed (as needed for pain). 30 tablet 0   levonorgestrel (MIRENA) 20 MCG/24HR IUD by Intrauterine route.     amLODipine (NORVASC) 5 MG tablet Take 1 tablet (5 mg total) by mouth daily. 90 tablet 3   busPIRone (BUSPAR) 7.5 MG tablet Take 1 tablet (7.5 mg total) by mouth 2 (two) times daily. 90 tablet 1   cetirizine (ZYRTEC) 10 MG tablet Take 1 tablet (10 mg total) by mouth daily. 90 tablet 1   cyclobenzaprine (FLEXERIL) 5 MG tablet Take 1 tablet (5 mg total) by mouth daily as needed for muscle spasms. 30 tablet 1   fluticasone (FLONASE) 50 MCG/ACT nasal spray Place 2 sprays into both nostrils daily. 16 g 3   hydrOXYzine (ATARAX/VISTARIL) 25 MG tablet Take 1 tablet (25 mg total) by mouth every 8 (eight) hours as needed. TAKE 1 TO 2 TABLETS BY MOUTH THREE TIMES A DAY AS NEEDED 90 tablet 1   lisinopril (ZESTRIL) 20 MG tablet Take 1 tablet (20 mg total) by mouth daily. 90 tablet 3   montelukast (SINGULAIR) 10 MG tablet Take 1 tablet (10 mg total) by mouth at bedtime. 30 tablet 3   No facility-administered medications prior to visit.    Allergies  Allergen Reactions   Effexor [Venlafaxine] Anxiety   Zoloft [Sertraline Hcl] Anxiety and Other (See Comments)    Review of Systems  Constitutional: Negative.   HENT: Negative.    Eyes: Negative.   Respiratory: Negative.    Gastrointestinal: Negative.   Genitourinary: Negative.   Musculoskeletal:  Positive for back pain.  Skin:  Negative for rash.  All other systems reviewed and are negative.     Objective:    Physical Exam Vitals and nursing note reviewed.  Constitutional:      Appearance: Normal appearance.  HENT:     Head: Normocephalic.     Nose: Nose normal.     Mouth/Throat:     Mouth: Mucous membranes are moist.     Pharynx: Oropharynx is clear.  Cardiovascular:     Rate and Rhythm: Normal rate and regular rhythm.      Pulses: Normal pulses.     Heart sounds: Normal heart sounds.  Pulmonary:     Effort: Pulmonary effort is normal.     Breath sounds: Normal breath sounds.  Abdominal:     General: Bowel sounds are normal.  Musculoskeletal:     Lumbar back: Tenderness present. Decreased range of  motion.  Neurological:     Mental Status: She is alert and oriented to person, place, and time.  Psychiatric:        Behavior: Behavior normal.    BP 124/83   Pulse 92   Temp 97.6 F (36.4 C) (Temporal)   Ht '5\' 3"'  (1.6 m)   Wt 225 lb (102.1 kg)   SpO2 97%   BMI 39.86 kg/m  Wt Readings from Last 3 Encounters:  05/14/21 225 lb (102.1 kg)  11/21/20 215 lb (97.5 kg)  10/23/20 219 lb (99.3 kg)    Health Maintenance Due  Topic Date Due   COVID-19 Vaccine (3 - Booster for Pfizer series) 09/09/2020   PAP SMEAR-Modifier  03/05/2021   INFLUENZA VACCINE  05/06/2021    There are no preventive care reminders to display for this patient.   Lab Results  Component Value Date   TSH 1.200 12/12/2016   Lab Results  Component Value Date   WBC 4.2 08/16/2020   HGB 13.4 08/16/2020   HCT 40.9 08/16/2020   MCV 83 08/16/2020   PLT 305 08/16/2020   Lab Results  Component Value Date   NA 135 03/28/2020   K 4.1 03/28/2020   CO2 22 03/28/2020   GLUCOSE 95 03/28/2020   BUN 5 (L) 03/28/2020   CREATININE 0.81 03/28/2020   BILITOT 0.3 03/28/2020   ALKPHOS 103 03/28/2020   AST 18 03/28/2020   ALT 18 03/28/2020   PROT 7.8 03/28/2020   ALBUMIN 4.0 03/28/2020   CALCIUM 9.2 03/28/2020   ANIONGAP 8 02/03/2019   Lab Results  Component Value Date   CHOL 180 03/28/2020   Lab Results  Component Value Date   HDL 26 (L) 03/28/2020   Lab Results  Component Value Date   LDLCALC 119 (H) 03/28/2020   Lab Results  Component Value Date   TRIG 196 (H) 03/28/2020   Lab Results  Component Value Date   CHOLHDL 6.9 (H) 03/28/2020   No results found for: HGBA1C     Assessment & Plan:   Problem List Items  Addressed This Visit       Cardiovascular and Mediastinum   Essential hypertension    Hypertension well-controlled.  No changes to current medication.  Rx refill sent to pharmacy.  Continue healthy diet low in sodium and exercise at least 3 times a week.       Relevant Medications   amLODipine (NORVASC) 5 MG tablet   lisinopril (ZESTRIL) 20 MG tablet   Other Relevant Orders   CBC with Differential   Comprehensive metabolic panel   Lipid Panel     Other   Chronic bilateral low back pain without sciatica   Relevant Medications   cyclobenzaprine (FLEXERIL) 5 MG tablet   Chronic anxiety    Patient is being seen by therapy and she reports she is improving.  Completed GAD-7.       Relevant Medications   busPIRone (BUSPAR) 7.5 MG tablet   hydrOXYzine (ATARAX/VISTARIL) 25 MG tablet   Dysuria - Primary    Complaint of dysuria, and flank pain on assessment and CVA tenderness.  Completed urinalysis results pending.  Increase hydration, Will treat and follow-up with worsening unresolved symptoms.       Relevant Orders   Urinalysis, Routine w reflex microscopic (Completed)   STD exposure    Completed STD testing results pending.  Education provided.       Relevant Orders   Urinalysis, Routine w reflex microscopic (Completed)   HSV  I/II IgM Rflx I/II Type Sp   RPR   HepB+HepC+HIV Panel   HIV antibody (with reflex)   Urine cytology ancillary only   Other Visit Diagnoses     Allergic rhinitis, unspecified seasonality, unspecified trigger       Relevant Medications   cetirizine (ZYRTEC) 10 MG tablet   fluticasone (FLONASE) 50 MCG/ACT nasal spray   montelukast (SINGULAIR) 10 MG tablet   Generalized anxiety disorder       Relevant Medications   busPIRone (BUSPAR) 7.5 MG tablet   hydrOXYzine (ATARAX/VISTARIL) 25 MG tablet        Meds ordered this encounter  Medications   amLODipine (NORVASC) 5 MG tablet    Sig: Take 1 tablet (5 mg total) by mouth daily.    Dispense:   90 tablet    Refill:  3    Order Specific Question:   Supervising Provider    Answer:   Janora Norlander [[id number]]   busPIRone (BUSPAR) 7.5 MG tablet    Sig: Take 1 tablet (7.5 mg total) by mouth 2 (two) times daily.    Dispense:  90 tablet    Refill:  1    Order Specific Question:   Supervising Provider    Answer:   Janora Norlander [[id number]]   cetirizine (ZYRTEC) 10 MG tablet    Sig: Take 1 tablet (10 mg total) by mouth daily.    Dispense:  90 tablet    Refill:  1    Order Specific Question:   Supervising Provider    Answer:   Janora Norlander [[id number]]   cyclobenzaprine (FLEXERIL) 5 MG tablet    Sig: Take 1 tablet (5 mg total) by mouth daily as needed for muscle spasms.    Dispense:  30 tablet    Refill:  1    Order Specific Question:   Supervising Provider    Answer:   Janora Norlander [[id number]]   fluticasone (FLONASE) 50 MCG/ACT nasal spray    Sig: Place 2 sprays into both nostrils daily.    Dispense:  16 g    Refill:  3    Order Specific Question:   Supervising Provider    Answer:   Janora Norlander [[id number]]   hydrOXYzine (ATARAX/VISTARIL) 25 MG tablet    Sig: Take 1 tablet (25 mg total) by mouth every 8 (eight) hours as needed. TAKE 1 TO 2 TABLETS BY MOUTH THREE TIMES A DAY AS NEEDED    Dispense:  90 tablet    Refill:  1    Order Specific Question:   Supervising Provider    Answer:   Janora Norlander [[id number]]   lisinopril (ZESTRIL) 20 MG tablet    Sig: Take 1 tablet (20 mg total) by mouth daily.    Dispense:  90 tablet    Refill:  3    Order Specific Question:   Supervising Provider    Answer:   Janora Norlander [[id number]]   montelukast (SINGULAIR) 10 MG tablet    Sig: Take 1 tablet (10 mg total) by mouth at bedtime.    Dispense:  30 tablet    Refill:  3    Order Specific Question:   Supervising Provider    Answer:   Janora Norlander [[id number]]     Ivy Lynn, NP

## 2021-05-14 NOTE — Patient Instructions (Signed)
Acute Back Pain, Adult Acute back pain is sudden and usually short-lived. It is often caused by an injury to the muscles and tissues in the back. The injury may result from: A muscle or ligament getting overstretched or torn (strained). Ligaments are tissues that connect bones to each other. Lifting something improperly can cause a back strain. Wear and tear (degeneration) of the spinal disks. Spinal disks are circular tissue that provide cushioning between the bones of the spine (vertebrae). Twisting motions, such as while playing sports or doing yard work. A hit to the back. Arthritis. You may have a physical exam, lab tests, and imaging tests to find the cause ofyour pain. Acute back pain usually goes away with rest and home care. Follow these instructions at home: Managing pain, stiffness, and swelling Treatment may include medicines for pain and inflammation that are taken by mouth or applied to the skin, prescription pain medicine, or muscle relaxants. Take over-the-counter and prescription medicines only as told by your health care provider. Your health care provider may recommend applying ice during the first 24-48 hours after your pain starts. To do this: Put ice in a plastic bag. Place a towel between your skin and the bag. Leave the ice on for 20 minutes, 2-3 times a day. If directed, apply heat to the affected area as often as told by your health care provider. Use the heat source that your health care provider recommends, such as a moist heat pack or a heating pad. Place a towel between your skin and the heat source. Leave the heat on for 20-30 minutes. Remove the heat if your skin turns bright red. This is especially important if you are unable to feel pain, heat, or cold. You have a greater risk of getting burned. Activity  Do not stay in bed. Staying in bed for more than 1-2 days can delay your recovery. Sit up and stand up straight. Avoid leaning forward when you sit or  hunching over when you stand. If you work at a desk, sit close to it so you do not need to lean over. Keep your chin tucked in. Keep your neck drawn back, and keep your elbows bent at a 90-degree angle (right angle). Sit high and close to the steering wheel when you drive. Add lower back (lumbar) support to your car seat, if needed. Take short walks on even surfaces as soon as you are able. Try to increase the length of time you walk each day. Do not sit, drive, or stand in one place for more than 30 minutes at a time. Sitting or standing for long periods of time can put stress on your back. Do not drive or use heavy machinery while taking prescription pain medicine. Use proper lifting techniques. When you bend and lift, use positions that put less stress on your back: Bend your knees. Keep the load close to your body. Avoid twisting. Exercise regularly as told by your health care provider. Exercising helps your back heal faster and helps prevent back injuries by keeping muscles strong and flexible. Work with a physical therapist to make a safe exercise program, as recommended by your health care provider. Do any exercises as told by your physical therapist.  Lifestyle Maintain a healthy weight. Extra weight puts stress on your back and makes it difficult to have good posture. Avoid activities or situations that make you feel anxious or stressed. Stress and anxiety increase muscle tension and can make back pain worse. Learn ways to manage   anxiety and stress, such as through exercise. General instructions Sleep on a firm mattress in a comfortable position. Try lying on your side with your knees slightly bent. If you lie on your back, put a pillow under your knees. Follow your treatment plan as told by your health care provider. This may include: Cognitive or behavioral therapy. Acupuncture or massage therapy. Meditation or yoga. Contact a health care provider if: You have pain that is not  relieved with rest or medicine. You have increasing pain going down into your legs or buttocks. Your pain does not improve after 2 weeks. You have pain at night. You lose weight without trying. You have a fever or chills. Get help right away if: You develop new bowel or bladder control problems. You have unusual weakness or numbness in your arms or legs. You develop nausea or vomiting. You develop abdominal pain. You feel faint. Summary Acute back pain is sudden and usually short-lived. Use proper lifting techniques. When you bend and lift, use positions that put less stress on your back. Take over-the-counter and prescription medicines and apply heat or ice as directed by your health care provider. This information is not intended to replace advice given to you by your health care provider. Make sure you discuss any questions you have with your healthcare provider. Document Revised: 06/12/2020 Document Reviewed: 06/15/2020 Elsevier Patient Education  2022 [place]. Dysuria Dysuria is pain or discomfort during urination. The pain or discomfort may be felt in the part of the body that drains urine from the bladder (urethra) or in the surrounding tissue of the genitals. The pain may also be felt inthe groin area, lower abdomen, or lower back. You may have to urinate frequently or have the sudden feeling that you have to urinate (urgency). Dysuria can affect anyone, but it is more common in females. Dysuria can be caused by many different things, including: Urinary tract infection. Kidney stones or bladder stones. Certain STIs (sexually transmitted infections), such as chlamydia. Dehydration. Inflammation of the tissues of the vagina. Use of certain medicines. Use of certain soaps or scented products that cause irritation. Follow these instructions at home: Medicines Take over-the-counter and prescription medicines only as told by your health care provider. If you were prescribed an  antibiotic medicine, take it as told by your health care provider. Do not stop taking the antibiotic even if you start to feel better. Eating and drinking  Drink enough fluid to keep your urine pale yellow. Avoid caffeinated beverages, tea, and alcohol. These beverages can irritate the bladder and make dysuria worse. In males, alcohol may irritate the prostate.  General instructions Watch your condition for any changes. Urinate often. Avoid holding urine for long periods of time. If you are female, you should wipe from front to back after urinating or having a bowel movement. Use each piece of toilet paper only once. Empty your bladder after sex. Keep all follow-up visits. This is important. If you had any tests done to find the cause of dysuria, it is up to you to get your test results. Ask your health care provider, or the department that is doing the test, when your results will be ready. Contact a health care provider if: You have a fever. You develop pain in your back or sides. You have nausea or vomiting. You have blood in your urine. You are not urinating as often as you usually do. Get help right away if: Your pain is severe and not relieved with medicines.  You cannot eat or drink without vomiting. You are confused. You have a rapid heartbeat while resting. You have shaking or chills. You feel extremely weak. Summary Dysuria is pain or discomfort while urinating. Many different conditions can lead to dysuria. If you have dysuria, you may have to urinate frequently or have the sudden feeling that you have to urinate (urgency). Watch your condition for any changes. Keep all follow-up visits. Make sure that you urinate often and drink enough fluid to keep your urine pale yellow. This information is not intended to replace advice given to you by your health care provider. Make sure you discuss any questions you have with your healthcare provider. Document Revised: 05/04/2020  Document Reviewed: 05/04/2020 Elsevier Patient Education  [place].

## 2021-05-14 NOTE — Assessment & Plan Note (Signed)
Completed STD testing results pending.  Education provided.

## 2021-05-14 NOTE — Assessment & Plan Note (Signed)
Patient is being seen by therapy and she reports she is improving.  Completed GAD-7.

## 2021-05-15 LAB — CBC WITH DIFFERENTIAL/PLATELET
Basophils Absolute: 0.1 10*3/uL (ref 0.0–0.2)
Basos: 1 %
EOS (ABSOLUTE): 0.2 10*3/uL (ref 0.0–0.4)
Eos: 2 %
Hematocrit: 38.5 % (ref 34.0–46.6)
Hemoglobin: 12.3 g/dL (ref 11.1–15.9)
Immature Grans (Abs): 0 10*3/uL (ref 0.0–0.1)
Immature Granulocytes: 0 %
Lymphocytes Absolute: 2.1 10*3/uL (ref 0.7–3.1)
Lymphs: 30 %
MCH: 26.4 pg — ABNORMAL LOW (ref 26.6–33.0)
MCHC: 31.9 g/dL (ref 31.5–35.7)
MCV: 83 fL (ref 79–97)
Monocytes Absolute: 0.6 10*3/uL (ref 0.1–0.9)
Monocytes: 9 %
Neutrophils Absolute: 3.9 10*3/uL (ref 1.4–7.0)
Neutrophils: 58 %
Platelets: 319 10*3/uL (ref 150–450)
RBC: 4.66 x10E6/uL (ref 3.77–5.28)
RDW: 13.2 % (ref 11.7–15.4)
WBC: 6.8 10*3/uL (ref 3.4–10.8)

## 2021-05-15 LAB — HEPB+HEPC+HIV PANEL
HIV Screen 4th Generation wRfx: NONREACTIVE
Hep B C IgM: NEGATIVE
Hep B Core Total Ab: NEGATIVE
Hep B E Ab: NEGATIVE
Hep B E Ag: NEGATIVE
Hep B Surface Ab, Qual: NONREACTIVE
Hep C Virus Ab: 0.1 s/co ratio (ref 0.0–0.9)
Hepatitis B Surface Ag: NEGATIVE

## 2021-05-15 LAB — COMPREHENSIVE METABOLIC PANEL
ALT: 24 IU/L (ref 0–32)
AST: 25 IU/L (ref 0–40)
Albumin/Globulin Ratio: 1.1 — ABNORMAL LOW (ref 1.2–2.2)
Albumin: 4.3 g/dL (ref 3.8–4.8)
Alkaline Phosphatase: 116 IU/L (ref 44–121)
BUN/Creatinine Ratio: 9 (ref 9–23)
BUN: 6 mg/dL (ref 6–20)
Bilirubin Total: 0.2 mg/dL (ref 0.0–1.2)
CO2: 25 mmol/L (ref 20–29)
Calcium: 9.7 mg/dL (ref 8.7–10.2)
Chloride: 98 mmol/L (ref 96–106)
Creatinine, Ser: 0.7 mg/dL (ref 0.57–1.00)
Globulin, Total: 4 g/dL (ref 1.5–4.5)
Glucose: 94 mg/dL (ref 65–99)
Potassium: 4.8 mmol/L (ref 3.5–5.2)
Sodium: 136 mmol/L (ref 134–144)
Total Protein: 8.3 g/dL (ref 6.0–8.5)
eGFR: 116 mL/min/{1.73_m2} (ref >59)

## 2021-05-15 LAB — LIPID PANEL
Chol/HDL Ratio: 6.8 ratio — ABNORMAL HIGH (ref 0.0–4.4)
Cholesterol, Total: 216 mg/dL — ABNORMAL HIGH (ref 100–199)
HDL: 32 mg/dL — ABNORMAL LOW (ref >39)
LDL Chol Calc (NIH): 145 mg/dL — ABNORMAL HIGH (ref 0–99)
Triglycerides: 213 mg/dL — ABNORMAL HIGH (ref 0–149)
VLDL Cholesterol Cal: 39 mg/dL (ref 5–40)

## 2021-05-15 LAB — RPR: RPR Ser Ql: NONREACTIVE

## 2021-05-15 LAB — HSV I/II IGM RFLX I/II TYPE SP: HSVI/II Comb IgM: <0.91 Ratio (ref 0.00–0.90)

## 2021-05-16 LAB — URINE CYTOLOGY ANCILLARY ONLY
Chlamydia: NEGATIVE
Comment: NEGATIVE
Comment: NEGATIVE
Comment: NORMAL
Neisseria Gonorrhea: NEGATIVE
Trichomonas: NEGATIVE

## 2021-08-16 ENCOUNTER — Encounter: Payer: Self-pay | Admitting: Nurse Practitioner

## 2021-08-16 ENCOUNTER — Ambulatory Visit: Payer: BC Managed Care – PPO | Admitting: Nurse Practitioner

## 2021-08-16 ENCOUNTER — Other Ambulatory Visit: Payer: Self-pay

## 2021-08-16 VITALS — BP 116/77 | HR 89 | Temp 97.3°F | Ht 63.0 in | Wt 234.4 lb

## 2021-08-16 DIAGNOSIS — Z202 Contact with and (suspected) exposure to infections with a predominantly sexual mode of transmission: Secondary | ICD-10-CM

## 2021-08-16 DIAGNOSIS — I10 Essential (primary) hypertension: Secondary | ICD-10-CM | POA: Diagnosis not present

## 2021-08-16 DIAGNOSIS — G4709 Other insomnia: Secondary | ICD-10-CM

## 2021-08-16 MED ORDER — TRAZODONE HCL 50 MG PO TABS
25.0000 mg | ORAL_TABLET | Freq: Every evening | ORAL | 3 refills | Status: AC | PRN
Start: 2021-08-16 — End: ?

## 2021-08-16 NOTE — Progress Notes (Signed)
Acute Office Visit  Subjective:    Patient ID: Alexis Donaldson, female    DOB: 11/12/1985, 35 y.o.   MRN: [id number]  Chief Complaint  Patient presents with   STD testing   Follow-up   Hypertension    Exposure to STD  The patient's primary symptoms include a discharge. This is a new problem. The current episode started in the past 7 days. The problem has been unchanged. The vaginal discharge was normal. Associate symptoms include abdominal pain. Pertinent negatives include no fever, sore throat or urinary frequency. She has tried nothing for the symptoms. Risk factors include history of STDs.     Pt presents for follow up of hypertension. Patient was diagnosed in 12/24/2018. The patient is tolerating the medication well without side effects. Compliance with treatment has been good; including taking medication as directed , maintains a healthy diet and regular exercise regimen , and following up as directed.    New symptoms of insomnia in the past few weeks.:  This is not new for patient as she has symptoms of seasonal insomnia.  In the past patient has used trazodone successfully and is now reporting that melatonin is not effective for sleep.   Past Medical History:  Diagnosis Date   Allergy    Anxiety    Asthma    Cervical dysplasia    Depression    HSV-1 (herpes simplex virus 1) infection    HSV-2 (herpes simplex virus 2) infection    Hypertension    STD (sexually transmitted disease)    Chlamydia X2,HSV    Past Surgical History:  Procedure Laterality Date   CERVICAL BIOPSY     COLPOSCOPY     GYNECOLOGIC CRYOSURGERY     Mirena     Inserted 04/07/2018   WISDOM TOOTH EXTRACTION      Family History  Problem Relation Age of Onset   Hyperlipidemia Mother    Hypertension Mother    Hypertension Father    Heart attack Father    Mental illness Sister    Diabetes Maternal Grandmother    Cancer Maternal Grandmother        Lung   Cancer Maternal Grandfather        Lung    Hypertension Maternal Grandfather    Heart disease Maternal Grandfather    Mental illness Paternal Grandmother    Hypertension Paternal Grandfather    Stroke Paternal Grandfather    Mental illness Sister     Social History   Socioeconomic History   Marital status: Single    Spouse name: Not on file   Number of children: 0   Years of education: Not on file   Highest education level: Not on file  Occupational History   Not on file  Tobacco Use   Smoking status: Former    Packs/day: 0.25    Years: 4.00    Pack years: 1.00    Types: Cigarettes   Smokeless tobacco: Never  Vaping Use   Vaping Use: Never used  Substance and Sexual Activity   Alcohol use: Yes    Alcohol/week: 8.0 standard drinks    Types: 4 Glasses of wine, 1 Cans of beer, 3 Shots of liquor per week   Drug use: Yes    Types: Marijuana    Comment: Occas   Sexual activity: Yes    Birth control/protection: I.U.D.    Comment: Mirena inserted 04/07/2018-1st intercourse 35 yo-More than 5 partners  Other Topics Concern   Not on file  Social  History Teacher, adult education   Social Determinants of Health   Financial Resource Strain: Not on file  Food Insecurity: Not on file  Transportation Needs: Not on file  Physical Activity: Not on file  Stress: Not on file  Social Connections: Not on file  Intimate Partner Violence: Not on file    Outpatient Medications Prior to Visit  Medication Sig Dispense Refill   albuterol (VENTOLIN HFA) 108 (90 Base) MCG/ACT inhaler Inhale 2 puffs into the lungs every 4 (four) hours as needed for wheezing or shortness of breath (cough, shortness of breath or wheezing.). 18 g 1   Blood Pressure Monitoring (BLOOD PRESSURE KIT) DEVI 1 Units by Does not apply route daily. 1 Device 0   busPIRone (BUSPAR) 7.5 MG tablet Take 1 tablet (7.5 mg total) by mouth 2 (two) times daily. 90 tablet 1   cetirizine (ZYRTEC) 10 MG tablet Take 1 tablet (10 mg total) by mouth daily. 90 tablet 1    cyclobenzaprine (FLEXERIL) 5 MG tablet Take 1 tablet (5 mg total) by mouth daily as needed for muscle spasms. 30 tablet 1   fluticasone (FLONASE) 50 MCG/ACT nasal spray Place 2 sprays into both nostrils daily. 16 g 3   hydrOXYzine (ATARAX/VISTARIL) 25 MG tablet Take 1 tablet (25 mg total) by mouth every 8 (eight) hours as needed. TAKE 1 TO 2 TABLETS BY MOUTH THREE TIMES A DAY AS NEEDED 90 tablet 1   ibuprofen (ADVIL) 800 MG tablet Take 1 tablet (800 mg total) by mouth every 6 (six) hours as needed (as needed for pain). 30 tablet 0   levonorgestrel (MIRENA) 20 MCG/24HR IUD by Intrauterine route.     montelukast (SINGULAIR) 10 MG tablet Take 1 tablet (10 mg total) by mouth at bedtime. 30 tablet 3   amLODipine (NORVASC) 5 MG tablet Take 1 tablet (5 mg total) by mouth daily. (Patient not taking: Reported on 08/16/2021) 90 tablet 3   beclomethasone (QVAR) 40 MCG/ACT inhaler Inhale 1 puff into the lungs 2 (two) times daily. (Patient not taking: Reported on 08/16/2021) 1 Inhaler 1   lisinopril (ZESTRIL) 20 MG tablet Take 1 tablet (20 mg total) by mouth daily. (Patient not taking: Reported on 08/16/2021) 90 tablet 3   No facility-administered medications prior to visit.    Allergies  Allergen Reactions   Effexor [Venlafaxine] Anxiety   Zoloft [Sertraline Hcl] Anxiety and Other (See Comments)    Review of Systems  Constitutional:  Negative for fever.  HENT:  Negative for sore throat.   Eyes: Negative.   Respiratory: Negative.    Gastrointestinal:  Positive for abdominal pain.  Genitourinary:  Negative for frequency.  Skin:  Negative for rash.  Psychiatric/Behavioral:  Positive for sleep disturbance. The patient is not nervous/anxious.   All other systems reviewed and are negative.     Objective:    Physical Exam Vitals reviewed.  Constitutional:      Appearance: She is obese.  HENT:     Head: Normocephalic.     Right Ear: Ear canal and external ear normal.     Left Ear: Ear canal  normal.     Nose: Nose normal.     Mouth/Throat:     Mouth: Mucous membranes are moist.     Pharynx: Oropharynx is clear.  Eyes:     Conjunctiva/sclera: Conjunctivae normal.  Cardiovascular:     Rate and Rhythm: Normal rate and regular rhythm.     Pulses: Normal pulses.     Heart sounds:  Normal heart sounds.  Pulmonary:     Effort: Pulmonary effort is normal.     Breath sounds: Normal breath sounds.  Abdominal:     General: Bowel sounds are normal.  Skin:    General: Skin is warm.     Findings: No rash.  Neurological:     Mental Status: She is alert and oriented to person, place, and time.  Psychiatric:        Behavior: Behavior normal.        Thought Content: Thought content normal.    BP 116/77   Pulse 89   Temp (!) 97.3 F (36.3 C) (Temporal)   Ht '5\' 3"'  (1.6 m)   Wt 234 lb 6.4 oz (106.3 kg)   SpO2 97%   BMI 41.52 kg/m  Wt Readings from Last 3 Encounters:  08/16/21 234 lb 6.4 oz (106.3 kg)  05/14/21 225 lb (102.1 kg)  11/21/20 215 lb (97.5 kg)    Health Maintenance Due  Topic Date Due   Pneumococcal Vaccine 32-80 Years old (1 - PCV) Never done   COVID-19 Vaccine (3 - Booster for Pfizer series) 06/04/2020   PAP SMEAR-Modifier  03/05/2021   INFLUENZA VACCINE  05/06/2021    There are no preventive care reminders to display for this patient.   Lab Results  Component Value Date   TSH 1.200 12/12/2016   Lab Results  Component Value Date   WBC 6.8 05/14/2021   HGB 12.3 05/14/2021   HCT 38.5 05/14/2021   MCV 83 05/14/2021   PLT 319 05/14/2021   Lab Results  Component Value Date   NA 136 05/14/2021   K 4.8 05/14/2021   CO2 25 05/14/2021   GLUCOSE 94 05/14/2021   BUN 6 05/14/2021   CREATININE 0.70 05/14/2021   BILITOT 0.2 05/14/2021   ALKPHOS 116 05/14/2021   AST 25 05/14/2021   ALT 24 05/14/2021   PROT 8.3 05/14/2021   ALBUMIN 4.3 05/14/2021   CALCIUM 9.7 05/14/2021   ANIONGAP 8 02/03/2019   EGFR 116 05/14/2021   Lab Results  Component Value  Date   CHOL 216 (H) 05/14/2021   Lab Results  Component Value Date   HDL 32 (L) 05/14/2021   Lab Results  Component Value Date   LDLCALC 145 (H) 05/14/2021   Lab Results  Component Value Date   TRIG 213 (H) 05/14/2021   Lab Results  Component Value Date   CHOLHDL 6.8 (H) 05/14/2021   No results found for: HGBA1C     Assessment & Plan:   Problem List Items Addressed This Visit       Cardiovascular and Mediastinum   Essential hypertension    Hypertension well-controlled on current medication no changes necessary.  Follow-up in 3 months.      Relevant Orders   CBC with Differential   Comprehensive metabolic panel   Lipid Panel     Other   Other insomnia    Recurrent insomnia symptoms not well controlled.  Started patient on trazodone 25 to 50 mg tablet at bedtime.  Provided education to patient printed handouts given.  Follow-up with worsening or unresolved symptoms.      Relevant Medications   traZODone (DESYREL) 50 MG tablet   Sexually transmitted disease exposure - Primary   Relevant Orders   HIV Antibody (routine testing w rflx)   Ct, Ng, Mycoplasmas NAA, Urine     Meds ordered this encounter  Medications   traZODone (DESYREL) 50 MG tablet    Sig:  Take 0.5-1 tablets (25-50 mg total) by mouth at bedtime as needed for sleep.    Dispense:  30 tablet    Refill:  3    Order Specific Question:   Supervising Provider    Answer:   Claretta Fraise [[id number]]     Ivy Lynn, NP

## 2021-08-16 NOTE — Patient Instructions (Addendum)
Insomnia Insomnia is a sleep disorder that makes it difficult to fall asleep or stay asleep. Insomnia can cause fatigue, low energy, difficulty concentrating, mood swings, and poor performance at work or school. There are three different ways to classify insomnia: Difficulty falling asleep. Difficulty staying asleep. Waking up too early in the morning. Any type of insomnia can be long-term (chronic) or short-term (acute). Both are common. Short-term insomnia usually lasts for three months or less. Chronic insomnia occurs at least three times a week for longer than three months. What are the causes? Insomnia may be caused by another condition, situation, or substance, such as: Anxiety. Certain medicines. Gastroesophageal reflux disease (GERD) or other gastrointestinal conditions. Asthma or other breathing conditions. Restless legs syndrome, sleep apnea, or other sleep disorders. Chronic pain. Menopause. Stroke. Abuse of alcohol, tobacco, or illegal drugs. Mental health conditions, such as depression. Caffeine. Neurological disorders, such as Alzheimer's disease. An overactive thyroid (hyperthyroidism). Sometimes, the cause of insomnia may not be known. What increases the risk? Risk factors for insomnia include: Gender. Women are affected more often than men. Age. Insomnia is more common as you get older. Stress. Lack of exercise. Irregular work schedule or working night shifts. Traveling between different time zones. Certain medical and mental health conditions. What are the signs or symptoms? If you have insomnia, the main symptom is having trouble falling asleep or having trouble staying asleep. This may lead to other symptoms, such as: Feeling fatigued or having low energy. Feeling nervous about going to sleep. Not feeling rested in the morning. Having trouble concentrating. Feeling irritable, anxious, or depressed. How is this diagnosed? This condition may be diagnosed  based on: Your symptoms and medical history. Your health care provider may ask about: Your sleep habits. Any medical conditions you have. Your mental health. A physical exam. How is this treated? Treatment for insomnia depends on the cause. Treatment may focus on treating an underlying condition that is causing insomnia. Treatment may also include: Medicines to help you sleep. Counseling or therapy. Lifestyle adjustments to help you sleep better. Follow these instructions at home: Eating and drinking  Limit or avoid alcohol, caffeinated beverages, and cigarettes, especially close to bedtime. These can disrupt your sleep. Do not eat a large meal or eat spicy foods right before bedtime. This can lead to digestive discomfort that can make it hard for you to sleep. Sleep habits  Keep a sleep diary to help you and your health care provider figure out what could be causing your insomnia. Write down: When you sleep. When you wake up during the night. How well you sleep. How rested you feel the next day. Any side effects of medicines you are taking. What you eat and drink. Make your bedroom a dark, comfortable place where it is easy to fall asleep. Put up shades or blackout curtains to block light from outside. Use a white noise machine to block noise. Keep the temperature cool. Limit screen use before bedtime. This includes: Watching TV. Using your smartphone, tablet, or computer. Stick to a routine that includes going to bed and waking up at the same times every day and night. This can help you fall asleep faster. Consider making a quiet activity, such as reading, part of your nighttime routine. Try to avoid taking naps during the day so that you sleep better at night. Get out of bed if you are still awake after 15 minutes of trying to sleep. Keep the lights down, but try reading or doing a  quiet activity. When you feel sleepy, go back to bed. General instructions Take over-the-counter  and prescription medicines only as told by your health care provider. Exercise regularly, as told by your health care provider. Avoid exercise starting several hours before bedtime. Use relaxation techniques to manage stress. Ask your health care provider to suggest some techniques that may work well for you. These may include: Breathing exercises. Routines to release muscle tension. Visualizing peaceful scenes. Make sure that you drive carefully. Avoid driving if you feel very sleepy. Keep all follow-up visits as told by your health care provider. This is important. Contact a health care provider if: You are tired throughout the day. You have trouble in your daily routine due to sleepiness. You continue to have sleep problems, or your sleep problems get worse. Get help right away if: You have serious thoughts about hurting yourself or someone else. If you ever feel like you may hurt yourself or others, or have thoughts about taking your own life, get help right away. You can go to your nearest emergency department or call: Your local emergency services (911 in the [place] A suicide crisis helpline, such as the [facility] at (954)207-7088 or 988 in the [place]. This is open 24 hours a day. Summary Insomnia is a sleep disorder that makes it difficult to fall asleep or stay asleep. Insomnia can be long-term (chronic) or short-term (acute). Treatment for insomnia depends on the cause. Treatment may focus on treating an underlying condition that is causing insomnia. Keep a sleep diary to help you and your health care provider figure out what could be causing your insomnia. This information is not intended to replace advice given to you by your health care provider. Make sure you discuss any questions you have with your health care provider. Document Revised: 04/17/2021 Document Reviewed: 08/02/2020 Elsevier Patient Education  2022 [place]. Hypertension,  Adult Hypertension is another name for high blood pressure. High blood pressure forces your heart to work harder to pump blood. This can cause problems over time. There are two numbers in a blood pressure reading. There is a top number (systolic) over a bottom number (diastolic). It is best to have a blood pressure that is below 120/80. Healthy choices can help lower your blood pressure, or you may need medicine to help lower it. What are the causes? The cause of this condition is not known. Some conditions may be related to high blood pressure. What increases the risk? Smoking. Having type 2 diabetes mellitus, high cholesterol, or both. Not getting enough exercise or physical activity. Being overweight. Having too much fat, sugar, calories, or salt (sodium) in your diet. Drinking too much alcohol. Having long-term (chronic) kidney disease. Having a family history of high blood pressure. Age. Risk increases with age. Race. You may be at higher risk if you are African American. Gender. Men are at higher risk than women before age 24. After age 85, women are at higher risk than men. Having obstructive sleep apnea. Stress. What are the signs or symptoms? High blood pressure may not cause symptoms. Very high blood pressure (hypertensive crisis) may cause: Headache. Feelings of worry or nervousness (anxiety). Shortness of breath. Nosebleed. A feeling of being sick to your stomach (nausea). Throwing up (vomiting). Changes in how you see. Very bad chest pain. Seizures. How is this treated? This condition is treated by making healthy lifestyle changes, such as: Eating healthy foods. Exercising more. Drinking less alcohol. Your health care provider may prescribe  medicine if lifestyle changes are not enough to get your blood pressure under control, and if: Your top number is above 130. Your bottom number is above 80. Your personal target blood pressure may vary. Follow these instructions  at home: Eating and drinking  If told, follow the DASH eating plan. To follow this plan: Fill one half of your plate at each meal with fruits and vegetables. Fill one fourth of your plate at each meal with whole grains. Whole grains include whole-wheat pasta, brown rice, and whole-grain bread. Eat or drink low-fat dairy products, such as skim milk or low-fat yogurt. Fill one fourth of your plate at each meal with low-fat (lean) proteins. Low-fat proteins include fish, chicken without skin, eggs, beans, and tofu. Avoid fatty meat, cured and processed meat, or chicken with skin. Avoid pre-made or processed food. Eat less than 1,500 mg of salt each day. Do not drink alcohol if: Your doctor tells you not to drink. You are pregnant, may be pregnant, or are planning to become pregnant. If you drink alcohol: Limit how much you use to: 0-1 drink a day for women. 0-2 drinks a day for men. Be aware of how much alcohol is in your drink. In the [place] one drink equals one 12 oz bottle of beer (355 mL), one 5 oz glass of wine (148 mL), or one 1 oz glass of hard liquor (44 mL). Lifestyle  Work with your doctor to stay at a healthy weight or to lose weight. Ask your doctor what the best weight is for you. Get at least 30 minutes of exercise most days of the week. This may include walking, swimming, or biking. Get at least 30 minutes of exercise that strengthens your muscles (resistance exercise) at least 3 days a week. This may include lifting weights or doing Pilates. Do not use any products that contain nicotine or tobacco, such as cigarettes, e-cigarettes, and chewing tobacco. If you need help quitting, ask your doctor. Check your blood pressure at home as told by your doctor. Keep all follow-up visits as told by your doctor. This is important. Medicines Take over-the-counter and prescription medicines only as told by your doctor. Follow directions carefully. Do not skip doses of blood pressure  medicine. The medicine does not work as well if you skip doses. Skipping doses also puts you at risk for problems. Ask your doctor about side effects or reactions to medicines that you should watch for. Contact a doctor if you: Think you are having a reaction to the medicine you are taking. Have headaches that keep coming back (recurring). Feel dizzy. Have swelling in your ankles. Have trouble with your vision. Get help right away if you: Get a very bad headache. Start to feel mixed up (confused). Feel weak or numb. Feel faint. Have very bad pain in your: Chest. Belly (abdomen). Throw up more than once. Have trouble breathing. Summary Hypertension is another name for high blood pressure. High blood pressure forces your heart to work harder to pump blood. For most people, a normal blood pressure is less than 120/80. Making healthy choices can help lower blood pressure. If your blood pressure does not get lower with healthy choices, you may need to take medicine. This information is not intended to replace advice given to you by your health care provider. Make sure you discuss any questions you have with your health care provider. Document Revised: 06/02/2018 Document Reviewed: 06/02/2018 Elsevier Patient Education  [place].

## 2021-08-16 NOTE — Assessment & Plan Note (Signed)
Hypertension well-controlled on current medication no changes necessary.  Follow-up in 3 months.

## 2021-08-16 NOTE — Assessment & Plan Note (Signed)
Recurrent insomnia symptoms not well controlled.  Started patient on trazodone 25 to 50 mg tablet at bedtime.  Provided education to patient printed handouts given.  Follow-up with worsening or unresolved symptoms.

## 2021-08-17 LAB — CBC WITH DIFFERENTIAL/PLATELET
Basophils Absolute: 0.1 10*3/uL (ref 0.0–0.2)
Basos: 1 %
EOS (ABSOLUTE): 0.1 10*3/uL (ref 0.0–0.4)
Eos: 3 %
Hematocrit: 39.8 % (ref 34.0–46.6)
Hemoglobin: 12.9 g/dL (ref 11.1–15.9)
Immature Grans (Abs): 0 10*3/uL (ref 0.0–0.1)
Immature Granulocytes: 0 %
Lymphocytes Absolute: 1.5 10*3/uL (ref 0.7–3.1)
Lymphs: 29 %
MCH: 26.6 pg (ref 26.6–33.0)
MCHC: 32.4 g/dL (ref 31.5–35.7)
MCV: 82 fL (ref 79–97)
Monocytes Absolute: 0.5 10*3/uL (ref 0.1–0.9)
Monocytes: 9 %
Neutrophils Absolute: 3 10*3/uL (ref 1.4–7.0)
Neutrophils: 58 %
Platelets: 318 10*3/uL (ref 150–450)
RBC: 4.85 x10E6/uL (ref 3.77–5.28)
RDW: 12.8 % (ref 11.7–15.4)
WBC: 5.2 10*3/uL (ref 3.4–10.8)

## 2021-08-17 LAB — COMPREHENSIVE METABOLIC PANEL
ALT: 24 IU/L (ref 0–32)
AST: 23 IU/L (ref 0–40)
Albumin/Globulin Ratio: 1.3 (ref 1.2–2.2)
Albumin: 4.5 g/dL (ref 3.8–4.8)
Alkaline Phosphatase: 110 IU/L (ref 44–121)
BUN/Creatinine Ratio: 8 — ABNORMAL LOW (ref 9–23)
BUN: 6 mg/dL (ref 6–20)
Bilirubin Total: 0.2 mg/dL (ref 0.0–1.2)
CO2: 25 mmol/L (ref 20–29)
Calcium: 9.5 mg/dL (ref 8.7–10.2)
Chloride: 103 mmol/L (ref 96–106)
Creatinine, Ser: 0.72 mg/dL (ref 0.57–1.00)
Globulin, Total: 3.6 g/dL (ref 1.5–4.5)
Glucose: 75 mg/dL (ref 70–99)
Potassium: 4.7 mmol/L (ref 3.5–5.2)
Sodium: 140 mmol/L (ref 134–144)
Total Protein: 8.1 g/dL (ref 6.0–8.5)
eGFR: 112 mL/min/{1.73_m2} (ref >59)

## 2021-08-17 LAB — LIPID PANEL
Chol/HDL Ratio: 7.8 ratio — ABNORMAL HIGH (ref 0.0–4.4)
Cholesterol, Total: 202 mg/dL — ABNORMAL HIGH (ref 100–199)
HDL: 26 mg/dL — ABNORMAL LOW (ref >39)
LDL Chol Calc (NIH): 137 mg/dL — ABNORMAL HIGH (ref 0–99)
Triglycerides: 215 mg/dL — ABNORMAL HIGH (ref 0–149)
VLDL Cholesterol Cal: 39 mg/dL (ref 5–40)

## 2021-08-17 LAB — HIV ANTIBODY (ROUTINE TESTING W REFLEX): HIV Screen 4th Generation wRfx: NONREACTIVE

## 2021-08-22 ENCOUNTER — Encounter: Payer: Self-pay | Admitting: Nurse Practitioner

## 2021-08-23 ENCOUNTER — Other Ambulatory Visit: Payer: Self-pay | Admitting: Nurse Practitioner

## 2021-08-23 DIAGNOSIS — R8290 Unspecified abnormal findings in urine: Secondary | ICD-10-CM

## 2021-08-23 LAB — CT, NG, MYCOPLASMAS NAA, URINE
Chlamydia trachomatis, NAA: NEGATIVE
Mycoplasma genitalium NAA: NEGATIVE
Mycoplasma hominis NAA: POSITIVE — AB
Neisseria gonorrhoeae, NAA: NEGATIVE
Ureaplasma spp NAA: POSITIVE — AB

## 2021-08-23 MED ORDER — DOXYCYCLINE HYCLATE 100 MG PO TABS: 100.0000 mg | ORAL_TABLET | Freq: Two times a day (BID) | ORAL | 0 refills | Status: DC

## 2021-09-04 ENCOUNTER — Telehealth: Payer: Self-pay | Admitting: Nurse Practitioner

## 2021-09-04 ENCOUNTER — Other Ambulatory Visit: Payer: Self-pay | Admitting: Nurse Practitioner

## 2021-09-04 DIAGNOSIS — R8290 Unspecified abnormal findings in urine: Secondary | ICD-10-CM

## 2021-09-04 NOTE — Telephone Encounter (Signed)
Patient has finished antibiotics and is wondering if you recommend she retest and if so, when?

## 2021-09-04 NOTE — Telephone Encounter (Signed)
Patient aware, orders placed.

## 2021-09-04 NOTE — Telephone Encounter (Signed)
Left message to call back  

## 2021-09-13 ENCOUNTER — Other Ambulatory Visit: Payer: BC Managed Care – PPO

## 2021-09-13 DIAGNOSIS — R8290 Unspecified abnormal findings in urine: Secondary | ICD-10-CM

## 2021-09-13 LAB — URINALYSIS, COMPLETE
Bilirubin, UA: NEGATIVE
Glucose, UA: NEGATIVE
Ketones, UA: NEGATIVE
Leukocytes,UA: NEGATIVE
Nitrite, UA: NEGATIVE
Protein,UA: NEGATIVE
RBC, UA: NEGATIVE
Specific Gravity, UA: 1.015 (ref 1.005–1.030)
Urobilinogen, Ur: 1 mg/dL (ref 0.2–1.0)
pH, UA: 7 (ref 5.0–7.5)

## 2021-09-13 LAB — MICROSCOPIC EXAMINATION
Bacteria, UA: NONE SEEN
RBC, Urine: NONE SEEN /HPF (ref 0–2)
WBC, UA: NONE SEEN /HPF (ref 0–5)

## 2021-09-17 LAB — URINE CULTURE

## 2021-10-22 ENCOUNTER — Encounter: Payer: Self-pay | Admitting: *Deleted

## 2021-11-27 ENCOUNTER — Encounter: Payer: Self-pay | Admitting: Nurse Practitioner

## 2021-11-27 DIAGNOSIS — E7800 Pure hypercholesterolemia, unspecified: Secondary | ICD-10-CM

## 2021-11-28 NOTE — Telephone Encounter (Signed)
Did you want to see patient back  in office or just repeat lipid panel

## 2021-12-05 ENCOUNTER — Ambulatory Visit: Payer: BC Managed Care – PPO | Admitting: Dietician

## 2021-12-05 NOTE — Progress Notes (Signed)
Assessment/Review of Medical Management, Hx, and Treatment Plan: ? ?Ht: 63"  ?Wt: 232#10oz ?BMI: 41.2 ?Labs: (08/16/21) CMP wnl except BUN bnl, chol anl, HDL bnl, TG anl, LDL anl, CBC wnl, glucose wnl(08/16/21) CMP wnl except BUN bnl, chol anl, HDL bnl, TG anl, LDL anl, CBC wnl, glucose wnl ?Meds: buspar, albuterol, buspirone, certirizine, flexeril, Flonase, hydroxyzine, mirena, sinulair, trazodone, fish oil ?MVI: None ?Dx/Presenting Problem: Desires Weight Loss, Obesity, HTN ?Living Conditions/Eco-Social Assessment:  Adequate stove, refrigerator and water source (city water) ?Language:  English ?Time:   9am    Duration:  45 minutes (CPT Code [phone]) ?MNT Provider in [place]:   Not WIC eligible ?Medical Order for MNT Service:  Self-referral ? ? ?Diagnostic Nutrition Assessment:  36yo female presents today as a self-referral.  She says that she just wants to be healthier and that she knows weight loss will be a by-product of doing so.  She was able to bring a 5-day food diary.  Food choices from the food diary reveal a diet that is moderate to high in fat, low in vegetables, fruits, and fiber.  She makes her own work hours and mentioned at least 3 jobs (1 full-time and 2 part-time).  Her lifestyle is busy.  She has made some positive modifications since her PCP appointment in November.  I complimented her on changes that have already been made.  She reports starting in the gym in December and she routinely goes for 3 days on most weeks.  She works out for 30 minutes to 1 hour.  She walks sporadically.  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  She reports drinking 64-100 ounces of water daily.  Sometimes it is infused with fruits and spices.  Occasionally she will use a Liquid IV hydration packet (not sugarless).  She has started drinking her teas without honey.  She is scheduled for a follow-up lipid panel next week due to her abnormal labs in  November.  Education highlighting portion control, decreasing fat/sugar intake, increasing physical activity and interpreting food labels may help to promote more a decrease in daily energy intake in order to promote gradual, healthy weight loss. ? ? ?24 Hour Recall/Diet Hx ? ? 2/25 2/26 2/27 2/28 3/1  ?BREAKFAST  WATERMELON AND HONEYDEW ? CORE POWER CHOCOLATE PROTEIN MILKSHAKE 2 AVOCADO TOASTS ?FRIED EGG 1/2 SLICED MANGO ?1/2 CUP WATERMELON ?PROTEIN SHAKE  ?SNACK 5 [place] ?? CUP MELONS FUNYUNS ?TURTLE BROWNIE RASPERRY CAKE ?BROWNIE ?WINE CAKE ?BLUEBERRY CAKE    ?LUNCH (RUBY Tuesday) BEEF KABOBS, RICE, ZUCCHINI ?SMALL BAG OF GOLDFISH GRILLED CHIDKEN SLIDER AND CURLY [place] ([place]) SMALL APPLE ?MANDARINE ORANGE ?ASIAN CRUNCH SALAD ([place]) CHICKEN SAUSAGE ([place]) CHOCOLATE SHAKE ?DOUBLE CHEESEBURGER  ?SNACK STRING CHEESE (1) ?SEA SALT POPCORNERS (SMALL BAG) ?KETTLE CORN POPCORNERS (SMALL BAG)  (COOKOUT) FRIED OKRA (2) WITH BBQ SAUSE ?STRAWBERRY SHAKE 4 REESE'S CUPS   ?DINNER  SPAGHETTI SQUASH ALFREDO ?1 SHOT-FIREBALL ENGLISH CUCUMBER ?CHERRY TOMOATOES ?ACV FETA/PARMESAN CHEESE ENGLISH CUCUMBER ?CHERRY TOMATOES ?CHICKEN SAUSAGE ?1 MANGO (JIMMY JOHNS) ?BEACH CLUB (REGULAR) ?MARGARITA ?  ? ? ?Diagnosis: ? ?Presents with excessive fat intake (N1-5.6.2) and excessive energy intake (N1-1.5) r/t poor portion control and unhealthy snack choices as evidenced by overweight/obesity with BMI >/+40 ([place]-3.3). ? ?Intervention/Nutrition Care Plan/Counseling: ? ?General recommendations were to (1) increase whole grain/fiber intake to promote satiety, (2) include fiber and protein with some snacks, (3)limit total fat and saturated fat intake, (4) increase  physical activity, (5) limit portions, (6) read food labels, and (7) increase fruit and vegetable intake.  Due to such a busy lifestyle I encouraged her to plan more especially when she is working on the weekends.  We also discussed scheduling meals/snacks around the  same times each day for more structure.  After reviewing her diet hx we focused on ways to limit fat/calorie intake and increase her fiber intake.  We discussed how to prepare foods like brown rice, whole grain pastas and vegetables.  We also discussed meal prepping, handling leftovers, portion control and increasing non-starchy vegetable intake daily.  Encouraged fruit intake daily and healthy snack choices.  We discussed taking walk breaks during the workday and walking on the days she does not go to the gym to increase physical activity.  We discussed other strategies for increasing physical activity (dancing to music, parking further away).  Education was provided for interpreting food labels.  Client suggested that she will start back making soups and salads in a jar as healthier meal options.  Provided literature:  MyPlate, Healthy Snacking, Decreasing Cholesterol. ? ?Monitoring/Evaluation: ? ?Will follow-up with a weight check in 2 months (02/09/22 @ 9am). ? ?Ambrose Pancoast, RD/LDN ?12/05/21 ?

## 2021-12-11 ENCOUNTER — Other Ambulatory Visit: Payer: Self-pay | Admitting: *Deleted

## 2021-12-11 ENCOUNTER — Other Ambulatory Visit: Payer: BC Managed Care – PPO

## 2021-12-11 DIAGNOSIS — E7800 Pure hypercholesterolemia, unspecified: Secondary | ICD-10-CM

## 2021-12-12 LAB — LIPID PANEL
Chol/HDL Ratio: 6.6 ratio — ABNORMAL HIGH (ref 0.0–4.4)
Cholesterol, Total: 204 mg/dL — ABNORMAL HIGH (ref 100–199)
HDL: 31 mg/dL — ABNORMAL LOW (ref >39)
LDL Chol Calc (NIH): 147 mg/dL — ABNORMAL HIGH (ref 0–99)
Triglycerides: 144 mg/dL (ref 0–149)
VLDL Cholesterol Cal: 26 mg/dL (ref 5–40)

## 2022-01-08 ENCOUNTER — Encounter: Payer: BC Managed Care – PPO | Admitting: Obstetrics

## 2022-01-28 ENCOUNTER — Encounter: Payer: Self-pay | Admitting: Obstetrics

## 2022-01-28 ENCOUNTER — Ambulatory Visit (INDEPENDENT_AMBULATORY_CARE_PROVIDER_SITE_OTHER): Payer: BC Managed Care – PPO | Admitting: Obstetrics

## 2022-01-28 ENCOUNTER — Other Ambulatory Visit (HOSPITAL_COMMUNITY)
Admission: RE | Admit: 2022-01-28 | Discharge: 2022-01-28 | Disposition: A | Discharge status: Home / Self Care | Payer: BC Managed Care – PPO | Source: Ambulatory Visit | Attending: Obstetrics | Admitting: Obstetrics

## 2022-01-28 VITALS — BP 133/87 | HR 71 | Ht 63.0 in | Wt 230.0 lb

## 2022-01-28 DIAGNOSIS — E66813 Obesity, class 3: Secondary | ICD-10-CM

## 2022-01-28 DIAGNOSIS — Z113 Encounter for screening for infections with a predominantly sexual mode of transmission: Secondary | ICD-10-CM | POA: Diagnosis not present

## 2022-01-28 DIAGNOSIS — Z01419 Encounter for gynecological examination (general) (routine) without abnormal findings: Secondary | ICD-10-CM | POA: Insufficient documentation

## 2022-01-28 DIAGNOSIS — Z6841 Body Mass Index (BMI) 40.0 and over, adult: Secondary | ICD-10-CM

## 2022-01-28 DIAGNOSIS — R102 Pelvic and perineal pain: Secondary | ICD-10-CM

## 2022-01-28 DIAGNOSIS — N898 Other specified noninflammatory disorders of vagina: Secondary | ICD-10-CM | POA: Insufficient documentation

## 2022-01-28 DIAGNOSIS — T8332XA Displacement of intrauterine contraceptive device, initial encounter: Secondary | ICD-10-CM

## 2022-01-28 NOTE — Progress Notes (Signed)
Pt is having some lower left side pelvic pain. ?Pt has IUD in place but cannot see strings. Pt states she has had IUD approx 3 years. Inserted by Dr Phineas Real in 04/2018. ? ?PHQ9- score 10 - pt has therapist she sees regularly.  ? ? ? ? ?

## 2022-01-28 NOTE — Progress Notes (Signed)
? ?Subjective: ? ? ?  ?  ? Alexis Donaldson is a 36 y.o. female here for a routine exam.  Current complaints: Pelvic pain and vaginal discharge.   ? ?Personal health questionnaire:  ?Is patient Ashkenazi Jewish, have a family history of breast and/or ovarian cancer: no ?Is there a family history of uterine cancer diagnosed at age < 72, gastrointestinal cancer, urinary tract cancer, family member who is a Field seismologist syndrome-associated carrier: no ?Is the patient overweight and hypertensive, family history of diabetes, personal history of gestational diabetes, preeclampsia or PCOS: yes ?Is patient over 84, have PCOS,  family history of premature CHD under age 69, diabetes, smoke, have hypertension or peripheral artery disease:  no ?At any time, has a partner hit, kicked or otherwise hurt or frightened you?: no ?Over the past 2 weeks, have you felt down, depressed or hopeless?: no ?Over the past 2 weeks, have you felt little interest or pleasure in doing things?:no ? ? ?Gynecologic History ?No LMP recorded. (Menstrual status: IUD). ?Contraception: IUD ?Last Pap: 2019. Results were: normal ?Last mammogram: n/a. Results were: n/a ? ?Obstetric History ?OB History  ?Gravida Para Term Preterm AB Living  ?0 0 0 0 0 0  ?SAB IAB Ectopic Multiple Live Births  ?0 0 0 0 0  ? ? ?Past Medical History:  ?Diagnosis Date  ? Allergy   ? Anxiety   ? Asthma   ? Cervical dysplasia   ? Depression   ? HSV-1 (herpes simplex virus 1) infection   ? HSV-2 (herpes simplex virus 2) infection   ? Hypertension   ? STD (sexually transmitted disease)   ? Chlamydia X2,HSV  ?  ?Past Surgical History:  ?Procedure Laterality Date  ? CERVICAL BIOPSY    ? COLPOSCOPY    ? GYNECOLOGIC CRYOSURGERY    ? Mirena    ? Inserted 04/07/2018  ? WISDOM TOOTH EXTRACTION    ?  ? ?Current Outpatient Medications:  ?  albuterol (VENTOLIN HFA) 108 (90 Base) MCG/ACT inhaler, Inhale 2 puffs into the lungs every 4 (four) hours as needed for wheezing or shortness of breath (cough,  shortness of breath or wheezing.)., Disp: 18 g, Rfl: 1 ?  beclomethasone (QVAR) 40 MCG/ACT inhaler, Inhale 1 puff into the lungs 2 (two) times daily., Disp: 1 Inhaler, Rfl: 1 ?  busPIRone (BUSPAR) 7.5 MG tablet, Take 1 tablet (7.5 mg total) by mouth 2 (two) times daily., Disp: 90 tablet, Rfl: 1 ?  cetirizine (ZYRTEC) 10 MG tablet, Take 1 tablet (10 mg total) by mouth daily., Disp: 90 tablet, Rfl: 1 ?  cyclobenzaprine (FLEXERIL) 5 MG tablet, Take 1 tablet (5 mg total) by mouth daily as needed for muscle spasms., Disp: 30 tablet, Rfl: 1 ?  fluticasone (FLONASE) 50 MCG/ACT nasal spray, Place 2 sprays into both nostrils daily., Disp: 16 g, Rfl: 3 ?  hydrOXYzine (ATARAX/VISTARIL) 25 MG tablet, Take 1 tablet (25 mg total) by mouth every 8 (eight) hours as needed. TAKE 1 TO 2 TABLETS BY MOUTH THREE TIMES A DAY AS NEEDED, Disp: 90 tablet, Rfl: 1 ?  levonorgestrel (MIRENA) 20 MCG/24HR IUD, by Intrauterine route., Disp: , Rfl:  ?  lisinopril (ZESTRIL) 20 MG tablet, Take 1 tablet (20 mg total) by mouth daily., Disp: 90 tablet, Rfl: 3 ?  magnesium 30 MG tablet, Take 30 mg by mouth 2 (two) times daily., Disp: , Rfl:  ?  montelukast (SINGULAIR) 10 MG tablet, Take 1 tablet (10 mg total) by mouth at bedtime., Disp: 30 tablet,  Rfl: 3 ?  Omega-3 Fatty Acids (FISH OIL) 1000 MG CAPS, Take by mouth., Disp: , Rfl:  ?  traZODone (DESYREL) 50 MG tablet, Take 0.5-1 tablets (25-50 mg total) by mouth at bedtime as needed for sleep., Disp: 30 tablet, Rfl: 3 ?  amLODipine (NORVASC) 5 MG tablet, Take 1 tablet (5 mg total) by mouth daily. (Patient not taking: Reported on 08/16/2021), Disp: 90 tablet, Rfl: 3 ?  Blood Pressure Monitoring (BLOOD PRESSURE KIT) DEVI, 1 Units by Does not apply route daily., Disp: 1 Device, Rfl: 0 ?  ibuprofen (ADVIL) 800 MG tablet, Take 1 tablet (800 mg total) by mouth every 6 (six) hours as needed (as needed for pain). (Patient not taking: Reported on 01/28/2022), Disp: 30 tablet, Rfl: 0 ?Allergies  ?Allergen Reactions   ? Effexor [Venlafaxine] Anxiety  ? Zoloft [Sertraline Hcl] Anxiety and Other (See Comments)  ?  ?Social History  ? ?Tobacco Use  ? Smoking status: Former  ?  Packs/day: 0.25  ?  Years: 4.00  ?  Pack years: 1.00  ?  Types: Cigarettes  ? Smokeless tobacco: Never  ?Substance Use Topics  ? Alcohol use: Yes  ?  Alcohol/week: 8.0 standard drinks  ?  Types: 4 Glasses of wine, 1 Cans of beer, 3 Shots of liquor per week  ?  Comment: occ  ?  ?Family History  ?Problem Relation Age of Onset  ? Hyperlipidemia Mother   ? Hypertension Mother   ? Hypertension Father   ? Heart attack Father   ? Mental illness Sister   ? Diabetes Maternal Grandmother   ? Cancer Maternal Grandmother   ?     Lung  ? Cancer Maternal Grandfather   ?     Lung  ? Hypertension Maternal Grandfather   ? Heart disease Maternal Grandfather   ? Mental illness Paternal Grandmother   ? Hypertension Paternal Grandfather   ? Stroke Paternal Grandfather   ? Mental illness Sister   ?  ? ? ?Review of Systems ? ?Constitutional: negative for fatigue and weight loss ?Respiratory: negative for cough and wheezing ?Cardiovascular: negative for chest pain, fatigue and palpitations ?Gastrointestinal: negative for abdominal pain and change in bowel habits ?Musculoskeletal:negative for myalgias ?Neurological: negative for gait problems and tremors ?Behavioral/Psych: negative for abusive relationship, depression ?Endocrine: negative for temperature intolerance    ?Genitourinary: positive for vaginal discharge  and pelvic pain.  negative for abnormal menstrual periods, genital lesions, hot flashes, sexual problems  ?Integument/breast: negative for breast lump, breast tenderness, nipple discharge and skin lesion(s) ? ?  ?Objective:  ? ?    ?BP 133/87   Pulse 71   Ht _0  (1.6 m)   Wt 230 lb (104.3 kg)   BMI 40.74 kg/m?  ?General:   Alert and no distress  ?Skin:   no rash or abnormalities  ?Lungs:   clear to auscultation bilaterally  ?Heart:   regular rate and rhythm, S1, S2  normal, no murmur, click, rub or gallop  ?Breasts:   normal without suspicious masses, skin or nipple changes or axillary nodes  ?Abdomen:  normal findings: no organomegaly, soft, non-tender and no hernia  ?Pelvis:  External genitalia: normal general appearance ?Urinary system: urethral meatus normal and bladder without fullness, nontender ?Vaginal: normal without tenderness, induration or masses ?Cervix: normal appearance ?Adnexa: normal bimanual exam ?Uterus: anteverted and non-tender, normal size  ? ?Lab Review ?Urine pregnancy test ?Labs reviewed yes ?Radiologic studies reviewed yes ? ?I have spent a total of 20 minutes  of face-to-face time, excluding clinical staff time, reviewing notes and preparing to see patient, ordering tests and/or medications, and counseling the patient.  ? ?Assessment:  ? ? 1. Encounter for gynecological examination with Papanicolaou smear of cervix ?Rx: ?- Cytology - PAP( [place]) ? ?2. Pelvic pain ?Rx: ?- US PELVIC COMPLETE WITH TRANSVAGINAL; Future ? ?3. Vaginal discharge ?Rx: ?- Cervicovaginal ancillary only( [place]) ? ?4. Screening for STD (sexually transmitted disease) ?Rx: ?- HIV Antibody (routine testing w rflx) ?- Hepatitis B surface antigen ?- RPR ?- Hepatitis C antibody ? ?5. Intrauterine contraceptive device threads lost, initial encounter ?Rx: ?- US PELVIC COMPLETE WITH TRANSVAGINAL; Future ? ?6. Class 3 severe obesity without serious comorbidity with body mass index (BMI) of 40.0 to 44.9 in adult, unspecified obesity type ([place]) ?- weight reduction with the aid of dietary changes, exercise and behavioral modificatioln recommended ?  ?  ?Plan:  ? ? Education reviewed: calcium supplements, depression evaluation, low fat, low cholesterol diet, safe sex/STD prevention, self breast exams, and weight bearing exercise. ?Contraception: IUD. ?Follow up in: 2 weeks.  ? ? ?Orders Placed This Encounter  ?Procedures  ? US PELVIC COMPLETE WITH TRANSVAGINAL  ?  Standing Status:    Future  ?  Standing Expiration Date:   01/29/2023  ?  Order Specific Question:   Reason for Exam (SYMPTOM  OR DIAGNOSIS REQUIRED)  ?  Answer:   Pelvic pain, LLQ.  IUD strings lost.  ?  Order Specific Question

## 2022-01-29 ENCOUNTER — Other Ambulatory Visit: Payer: Self-pay | Admitting: Obstetrics

## 2022-01-29 DIAGNOSIS — N760 Acute vaginitis: Secondary | ICD-10-CM

## 2022-01-29 LAB — HEPATITIS B SURFACE ANTIGEN: Hepatitis B Surface Ag: NEGATIVE

## 2022-01-29 LAB — CERVICOVAGINAL ANCILLARY ONLY
Bacterial Vaginitis (gardnerella): POSITIVE — AB
Candida Glabrata: NEGATIVE
Candida Vaginitis: NEGATIVE
Chlamydia: NEGATIVE
Comment: NEGATIVE
Comment: NEGATIVE
Comment: NEGATIVE
Comment: NEGATIVE
Comment: NEGATIVE
Comment: NORMAL
Neisseria Gonorrhea: NEGATIVE
Trichomonas: NEGATIVE

## 2022-01-29 LAB — RPR: RPR Ser Ql: NONREACTIVE

## 2022-01-29 LAB — HIV ANTIBODY (ROUTINE TESTING W REFLEX): HIV Screen 4th Generation wRfx: NONREACTIVE

## 2022-01-29 LAB — HEPATITIS C ANTIBODY: Hep C Virus Ab: NONREACTIVE

## 2022-01-29 MED ORDER — METRONIDAZOLE 500 MG PO TABS: 500.0000 mg | ORAL_TABLET | Freq: Two times a day (BID) | ORAL | 2 refills | Status: AC

## 2022-01-30 ENCOUNTER — Telehealth: Payer: Self-pay

## 2022-01-30 LAB — CYTOLOGY - PAP
Comment: NEGATIVE
Diagnosis: NEGATIVE
High risk HPV: NEGATIVE

## 2022-01-30 NOTE — Telephone Encounter (Signed)
Attempted to contact about results and rx, no answer, vm is full ?

## 2022-02-03 ENCOUNTER — Ambulatory Visit (HOSPITAL_BASED_OUTPATIENT_CLINIC_OR_DEPARTMENT_OTHER)
Admission: RE | Admit: 2022-02-03 | Discharge: 2022-02-03 | Disposition: A | Discharge status: Home / Self Care | Payer: BC Managed Care – PPO | Source: Ambulatory Visit | Attending: Obstetrics | Admitting: Obstetrics

## 2022-02-03 DIAGNOSIS — N83202 Unspecified ovarian cyst, left side: Secondary | ICD-10-CM | POA: Diagnosis not present

## 2022-02-03 DIAGNOSIS — T8332XA Displacement of intrauterine contraceptive device, initial encounter: Secondary | ICD-10-CM | POA: Insufficient documentation

## 2022-02-03 DIAGNOSIS — R102 Pelvic and perineal pain: Secondary | ICD-10-CM | POA: Diagnosis not present

## 2022-02-06 ENCOUNTER — Ambulatory Visit: Payer: BC Managed Care – PPO | Admitting: Dietician

## 2022-02-06 NOTE — Progress Notes (Signed)
Ht:       63"        ?Wt:      228#13oz ?BMI:    40.5 ?Labs:   (12/11/21) chol @ 204 and increased since last review, TG @ 144 and improved since last review, HDL 31 (below recommendations) improved since last review, HDL @ 147 and increased since last review ?MVI:     None ?Dx/Presenting Problem:       Desires Weight Loss, Obesity, HTN ?Living Conditions/Eco-Social Assessment:  Adequate stove, refrigerator and water source (city water) ?Language:  English ?Time:   9am                                         Duration:  45 minutes (CPT Code [id number]) ?MNT Provider in [place]:   Not WIC eligible ?Medical Order for MNT Service:  Self-referral ?  ?  ?Diagnostic Nutrition Assessment:  36yo female presents today for a follow-up appointment.  Overall she feels an improvement in her intake and she feels that she listens more to her hunger/satiety cues.  She reports that she ate unhealthy over the weekend due to traveling for and assisting with a wedding.   Her lifestyle remains busy.  She has continued her workouts at the gym she routinely goes for 3 days on most weeks.  She has a new goal of increasing this to four days/week.  She works out for 30 minutes to 1 hour.  She walks sporadically, but has been trying to fit in more walking sessions with little success.  She reports using a new application called Carb Manager, which tracks all of the macronutrients and fiber.  Her goal is to limit her calorie intake 1700-2000kcals/day.  She has increased her fiber intake to 25g on most days.  She has noticed that on some days her calorie intake is well below the minimum recommended range and she has to force herself to eat.  Some days she will not eat until 8pm.  She has started limiting added sugars, and she is now incorporating at least two servings of fruits and two servings of vegetables daily.  She reports difficulty with sleeping but plans to discuss this with her PCP and therapist.  She snacks more when awake  during the night, but has tried to ensure that the snacks are healthy.  Snacks include Skinny Pop popcorn, Atkins Snacks averaging 90-180 calories per serving and bananas.  Portion control is an issue with the popcorn so she has tried to make accessing additional portions more difficult.  She also eats a lot of cheese.  She is scheduled for a follow-up lipid panel soon due to her abnormal labs in March.  Will reiterate education highlighting portion control, decreasing fat/sugar intake, increasing physical activity and interpreting food labels may help to promote more a decrease in daily energy intake in order to promote gradual, healthy weight loss. ?  ?  ?Diagnosis: ?  ?Presents with excessive fat intake (N1-5.6.2) and excessive energy intake (N1-1.5) r/t poor portion control as evidenced by overweight/obesity with BMI >/+40 ([place]-3.3). ?  ?Intervention/Nutrition Care Plan/Counseling: ?  ?General recommendations were to (1) continue with increased whole grain/fiber intake to promote satiety, (2) continue to include fiber and protein with snacks, (3) limit total fat and saturated fat intake, (4) increase physical activity, (5) limit portions, (6) read food labels, and (7 )continue with increased fruit intake  and (8) increase vegetable intake.  We discussed scheduling meals/snacks around the same times each day for more structure.  Tam feels that it is easier to remember to eat after going to the gym.  We also discussed fitting in more walking sessions on the days she does not go to the gym to promote a daily routine.  Discussed the adverse effects of skipping meals.  Supported the goal of adding in another day each week for going to the gym.  We also discussed meal prepping, handling leftovers, portion control and increasing non-starchy vegetable intake daily by an additional two servings daily.  Praised for increased fruit intake daily and healthy snack choices.  We discussed incorporating more taking walk breaks  during the workday to increase physical activity.  Provided education for interpreting food labels at initial appointment and Etana reports focusing on food labels a lot more.  Suggested meal prepping with her crock-pot meals and prepping salads for late night snacks.  Encouraged choosing low-fat/fat-free cheeses.  We set a goal of 10-15% total weight loss to promote an improvement in lab values.   ?  ?Monitoring/Evaluation: ?  ?Will follow-up with a weight check in 2 months (04/10/22 @ 9am). ?  ?Ambrose Pancoast, RD/LDN ?02/06/22 ?

## 2022-02-10 ENCOUNTER — Telehealth: Payer: BC Managed Care – PPO | Admitting: Obstetrics

## 2022-02-14 ENCOUNTER — Encounter: Payer: Self-pay | Admitting: Nurse Practitioner

## 2022-02-14 ENCOUNTER — Ambulatory Visit: Payer: BC Managed Care – PPO | Admitting: Nurse Practitioner

## 2022-02-14 VITALS — BP 120/78 | HR 80 | Temp 98.6°F | Ht 63.0 in | Wt 221.0 lb

## 2022-02-14 DIAGNOSIS — G8929 Other chronic pain: Secondary | ICD-10-CM

## 2022-02-14 DIAGNOSIS — M545 Low back pain, unspecified: Secondary | ICD-10-CM | POA: Diagnosis not present

## 2022-02-14 DIAGNOSIS — F419 Anxiety disorder, unspecified: Secondary | ICD-10-CM | POA: Diagnosis not present

## 2022-02-14 DIAGNOSIS — F411 Generalized anxiety disorder: Secondary | ICD-10-CM

## 2022-02-14 DIAGNOSIS — J309 Allergic rhinitis, unspecified: Secondary | ICD-10-CM

## 2022-02-14 DIAGNOSIS — I10 Essential (primary) hypertension: Secondary | ICD-10-CM

## 2022-02-14 MED ORDER — CYCLOBENZAPRINE HCL 5 MG PO TABS: 5.0000 mg | ORAL_TABLET | Freq: Every day | ORAL | 1 refills | Status: AC | PRN

## 2022-02-14 MED ORDER — HYDROXYZINE HCL 25 MG PO TABS: 25.0000 mg | ORAL_TABLET | Freq: Three times a day (TID) | ORAL | 1 refills | Status: AC | PRN

## 2022-02-14 MED ORDER — MONTELUKAST SODIUM 10 MG PO TABS: 10.0000 mg | ORAL_TABLET | Freq: Every day | ORAL | 3 refills | Status: AC

## 2022-02-14 MED ORDER — BUSPIRONE HCL 7.5 MG PO TABS: 7.5000 mg | ORAL_TABLET | Freq: Two times a day (BID) | ORAL | 1 refills | Status: AC

## 2022-02-14 NOTE — Assessment & Plan Note (Signed)
Hypertension well controlled, patient reports not taking medication as prescribed, and knows to check BP daily or as needed.  ? ?Follow up in 6 months. ?

## 2022-02-14 NOTE — Progress Notes (Signed)
? ?Established Patient Office Visit ? ?Subjective   ?Patient ID: Alexis Donaldson, female    DOB: May 21, 1986  Age: 36 y.o. MRN: [id number] ? ?Chief Complaint  ?Patient presents with  ? Medical Management of Chronic Issues  ?  6 month  ? Insomnia  ?  Pt states she is having a hard time sleeping the last 3 weeks , waking up through the night , having a hard time falling asleep   ? ? ?HPI ? ?Hypertension, follow-up ? ?BP Readings from Last 3 Encounters:  ?02/14/22 120/78  ?01/28/22 133/87  ?08/16/21 116/77  ? Wt Readings from Last 3 Encounters:  ?02/14/22 221 lb (100.2 kg)  ?01/28/22 230 lb (104.3 kg)  ?08/16/21 234 lb 6.4 oz (106.3 kg)  ?  ? ?She was last seen for hypertension 6 months ago.  ?BP at that visit was not controlled . Management since that visit includes lisinopril 5 mg tablet by mouth daily. ? ?She reports good compliance with treatment. ?She is not having side effects.  ?She is following a Low Sodium diet. ?She is exercising. ?She does not smoke. ? ?Use of agents associated with hypertension: none.  ? ?Outside blood pressures are . ?Symptoms: ?No chest pain No chest pressure  ?No palpitations No syncope  ?No dyspnea No orthopnea  ?No paroxysmal nocturnal dyspnea No lower extremity edema  ? ?Pertinent labs ?Lab Results  ?Component Value Date  ? CHOL 204 (H) 12/11/2021  ? HDL 31 (L) 12/11/2021  ? LDLCALC 147 (H) 12/11/2021  ? TRIG 144 12/11/2021  ? CHOLHDL 6.6 (H) 12/11/2021  ? Lab Results  ?Component Value Date  ? NA 140 08/16/2021  ? K 4.7 08/16/2021  ? CREATININE 0.72 08/16/2021  ? EGFR 112 08/16/2021  ? GLUCOSE 75 08/16/2021  ? TSH 1.200 12/12/2016  ?  ? ?The ASCVD Risk score (Arnett DK, et al., 2019) failed to calculate for the following reasons: ?  The 2019 ASCVD risk score is only valid for ages 81 to 20 ? ?---------------------------------------------------------------------------------------------------  ? ?Anxiety, Follow-up ? ?She was last seen for anxiety 6 months ago. ?Changes made at last visit  include:  Atarax 25 mg tablet by mouth as needed. ?  ?She reports good compliance with treatment. ?She reports good tolerance of treatment. ?She is not having side effects.  ? ?She feels her anxiety is mild and Improved since last visit. ? ?Symptoms: ?No chest pain No difficulty concentrating  ?No dizziness No fatigue  ?No feelings of losing control Yes insomnia  ?Yes irritable No palpitations  ?No panic attacks Yes racing thoughts  ?No shortness of breath No sweating  ?No tremors/shakes   ? ?GAD-7 Results ? ?  02/14/2022  ?  8:50 AM 08/16/2021  ?  1:55 PM 05/14/2021  ?  2:33 PM  ?GAD-7 Generalized Anxiety Disorder Screening Tool  ?1. Feeling Nervous, Anxious, or on Edge '2 3 1  ' ?2. Not Being Able to Stop or Control Worrying 0 1 2  ?3. Worrying Too Much About Different Things '1 1 1  ' ?4. Trouble Relaxing 2 2 0  ?5. Being So Restless it's Hard To Sit Still 1 2 0  ?6. Becoming Easily Annoyed or Irritable '1 1 1  ' ?7. Feeling Afraid As If Something Awful Might Happen 1 2 0  ?Total GAD-7 Score '8 12 5  ' ?Difficulty At Work, Home, or Getting  Along With Others?  Somewhat difficult Somewhat difficult  ? ? ?PHQ-9 Scores ? ?  02/14/2022  ?  8:49 AM 01/28/2022  ?  2:35 PM 08/16/2021  ?  1:54 PM  ?PHQ9 SCORE ONLY  ?PHQ-9 Total Score '8 10 11  ' ? ? ?Back Pain ? ?She reports chronic back pain. There was not an injury that may have caused the pain. The most recent episode started several months ago and is staying constant. The pain is located in the across the lower backwithout radiation. It is described as aching, is moderate in intensity, occurring intermittently. Symptoms are worse in the: morning, mid-day, afternoon, evening  ?Aggravating factors: exercising   Relieving factors: bending backwards and medication Muscle relaxant .  ?She has tried application of heat, NSAIDs, and prescription pain relievers with significant relief.  ? ?Associated symptoms: ?No abdominal pain No bowel incontinence  ?No chest pain No dysuria   ?No fever No  headaches  ?No joint pains No pelvic pain  ?No weakness in leg ? No tingling in lower extremities  ?No urinary incontinence No weight loss  ? ? ?  ? ?Review of Systems  ?Constitutional: Negative.   ?HENT: Negative.    ?Eyes: Negative.   ?Respiratory: Negative.    ?Cardiovascular: Negative.   ?Genitourinary: Negative.   ?Musculoskeletal: Negative.   ?Skin: Negative.   ?All other systems reviewed and are negative. ? ?  ?Objective:  ?  ? ?BP 120/78   Pulse 80   Temp 98.6 ?F (37 ?C)   Ht '5\' 3"'  (1.6 m)   Wt 221 lb (100.2 kg)   SpO2 98%   BMI 39.15 kg/m?  ?  ? ?Physical Exam ?Vitals and nursing note reviewed.  ?Constitutional:   ?   Appearance: Normal appearance.  ?HENT:  ?   Head: Normocephalic.  ?   Right Ear: External ear normal.  ?   Left Ear: External ear normal.  ?   Nose: Nose normal.  ?   Mouth/Throat:  ?   Mouth: Mucous membranes are moist.  ?   Pharynx: Oropharynx is clear.  ?Eyes:  ?   Conjunctiva/sclera: Conjunctivae normal.  ?Cardiovascular:  ?   Rate and Rhythm: Normal rate and regular rhythm.  ?Pulmonary:  ?   Effort: Pulmonary effort is normal.  ?   Breath sounds: Normal breath sounds.  ?Abdominal:  ?   General: Bowel sounds are normal.  ?Musculoskeletal:     ?   General: Normal range of motion.  ?Skin: ?   General: Skin is warm.  ?Neurological:  ?   General: No focal deficit present.  ?   Mental Status: She is alert and oriented to person, place, and time.  ?Psychiatric:     ?   Attention and Perception: Attention normal.     ?   Mood and Affect: Affect normal. Mood is anxious.     ?   Behavior: Behavior normal. Behavior is cooperative.     ?   Thought Content: Thought content normal.  ? ? ? ?No results found for any visits on 02/14/22. ? ?  ? ?The ASCVD Risk score (Arnett DK, et al., 2019) failed to calculate for the following reasons: ?  The 2019 ASCVD risk score is only valid for ages 45 to 108 ? ?  ?Assessment & Plan:  ? ?Problem List Items Addressed This Visit   ? ?  ? Cardiovascular and  Mediastinum  ? Essential hypertension - Primary  ?  Hypertension well controlled, patient reports not taking medication as prescribed, and knows to check BP daily or as needed.  ? ?Follow up in 6 months. ? ?  ?  ?  Relevant Orders  ? CBC with Differential  ? Comprehensive metabolic panel  ? Lipid Panel  ?  ? Other  ? Chronic bilateral low back pain without sciatica  ? Relevant Medications  ? cyclobenzaprine (FLEXERIL) 5 MG tablet  ? Chronic anxiety  ?  Anxiety well controlled on current medication, symptoms are improving.  ? ?  ?  ? Relevant Medications  ? hydrOXYzine (ATARAX) 25 MG tablet  ? busPIRone (BUSPAR) 7.5 MG tablet  ? ?Other Visit Diagnoses   ? ? Allergic rhinitis, unspecified seasonality, unspecified trigger      ? Relevant Medications  ? montelukast (SINGULAIR) 10 MG tablet  ? Other Relevant Orders  ? ABO/Rh  ? Generalized anxiety disorder      ? Relevant Medications  ? hydrOXYzine (ATARAX) 25 MG tablet  ? busPIRone (BUSPAR) 7.5 MG tablet  ? ?  ? ? ?Return in about 6 months (around 08/17/2022) for chronic Diseas management.  ? ? ?Ivy Lynn, NP ? ?

## 2022-02-14 NOTE — Patient Instructions (Signed)
Generalized Anxiety Disorder, Adult ?Generalized anxiety disorder (GAD) is a mental health condition. Unlike normal worries, anxiety related to GAD is not triggered by a specific event. These worries do not fade or get better with time. GAD interferes with relationships, work, and school. ?GAD symptoms can vary from mild to severe. People with severe GAD can have intense waves of anxiety with physical symptoms that are similar to panic attacks. ?What are the causes? ?The exact cause of GAD is not known, but the following are believed to have an impact: ?Differences in natural brain chemicals. ?Genes passed down from parents to children. ?Differences in the way threats are perceived. ?Development and stress during childhood. ?Personality. ?What increases the risk? ?The following factors may make you more likely to develop this condition: ?Being female. ?Having a family history of anxiety disorders. ?Being very shy. ?Experiencing very stressful life events, such as the death of a loved one. ?Having a very stressful family environment. ?What are the signs or symptoms? ?People with GAD often worry excessively about many things in their lives, such as their health and family. Symptoms may also include: ?Mental and emotional symptoms: ?Worrying excessively about natural disasters. ?Fear of being late. ?Difficulty concentrating. ?Fears that others are judging your performance. ?Physical symptoms: ?Fatigue. ?Headaches, muscle tension, muscle twitches, trembling, or feeling shaky. ?Feeling like your heart is pounding or beating very fast. ?Feeling out of breath or like you cannot take a deep breath. ?Having trouble falling asleep or staying asleep, or experiencing restlessness. ?Sweating. ?Nausea, diarrhea, or irritable bowel syndrome (IBS). ?Behavioral symptoms: ?Experiencing erratic moods or irritability. ?Avoidance of new situations. ?Avoidance of people. ?Extreme difficulty making decisions. ?How is this diagnosed? ?This  condition is diagnosed based on your symptoms and medical history. You will also have a physical exam. Your health care provider may perform tests to rule out other possible causes of your symptoms. ?To be diagnosed with GAD, a person must have anxiety that: ?Is out of his or her control. ?Affects several different aspects of his or her life, such as work and relationships. ?Causes distress that makes him or her unable to take part in normal activities. ?Includes at least three symptoms of GAD, such as restlessness, fatigue, trouble concentrating, irritability, muscle tension, or sleep problems. ?Before your health care provider can confirm a diagnosis of GAD, these symptoms must be present more days than they are not, and they must last for 6 months or longer. ?How is this treated? ?This condition may be treated with: ?Medicine. Antidepressant medicine is usually prescribed for long-term daily control. Anti-anxiety medicines may be added in severe cases, especially when panic attacks occur. ?Talk therapy (psychotherapy). Certain types of talk therapy can be helpful in treating GAD by providing support, education, and guidance. Options include: ?Cognitive behavioral therapy (CBT). People learn coping skills and self-calming techniques to ease their physical symptoms. They learn to identify unrealistic thoughts and behaviors and to replace them with more appropriate thoughts and behaviors. ?Acceptance and commitment therapy (ACT). This treatment teaches people how to be mindful as a way to cope with unwanted thoughts and feelings. ?Biofeedback. This process trains you to manage your body's response (physiological response) through breathing techniques and relaxation methods. You will work with a therapist while machines are used to monitor your physical symptoms. ?Stress management techniques. These include yoga, meditation, and exercise. ?A mental health specialist can help determine which treatment is best for you.  Some people see improvement with one type of therapy. However, other people require   a combination of therapies. ?Follow these instructions at home: ?Lifestyle ?Maintain a consistent routine and schedule. ?Anticipate stressful situations. Create a plan and allow extra time to work with your plan. ?Practice stress management or self-calming techniques that you have learned from your therapist or your health care provider. ?Exercise regularly and spend time outdoors. ?Eat a healthy diet that includes plenty of vegetables, fruits, whole grains, low-fat dairy products, and lean protein. ?Do not eat a lot of foods that are high in fat, added sugar, or salt (sodium). ?Drink plenty of water. ?Avoid alcohol. Alcohol can increase anxiety. ?Avoid caffeine and certain over-the-counter cold medicines. These may make you feel worse. Ask your pharmacist which medicines to avoid. ?General instructions ?Take over-the-counter and prescription medicines only as told by your health care provider. ?Understand that you are likely to have setbacks. Accept this and be kind to yourself as you persist to take better care of yourself. ?Anticipate stressful situations. Create a plan and allow extra time to work with your plan. ?Recognize and accept your accomplishments, even if you judge them as small. ?Spend time with people who care about you. ?Keep all follow-up visits. This is important. ?Where to find more information ?[employer] of Mental Health: https://carter.com/ ?Substance Abuse and Mental Health Services: ktimeonline.com ?Contact a health care provider if: ?Your symptoms do not get better. ?Your symptoms get worse. ?You have signs of depression, such as: ?A persistently sad or irritable mood. ?Loss of enjoyment in activities that used to bring you joy. ?Change in weight or eating. ?Changes in sleeping habits. ?Get help right away if: ?You have thoughts about hurting yourself or others. ?If you ever feel like you may hurt  yourself or others, or have thoughts about taking your own life, get help right away. Go to your nearest emergency department or: ?Call your local emergency services (911 in the [place] ?Call a suicide crisis helpline, such as the [facility] at 4176293615 or 988 in the [place]. This is open 24 hours a day in the [place] ?Text the Crisis Text Line at 830-756-5567 (in the [place].). ?Summary ?Generalized anxiety disorder (GAD) is a mental health condition that involves worry that is not triggered by a specific event. ?People with GAD often worry excessively about many things in their lives, such as their health and family. ?GAD may cause symptoms such as restlessness, trouble concentrating, sleep problems, frequent sweating, nausea, diarrhea, headaches, and trembling or muscle twitching. ?A mental health specialist can help determine which treatment is best for you. Some people see improvement with one type of therapy. However, other people require a combination of therapies. ?This information is not intended to replace advice given to you by your health care provider. Make sure you discuss any questions you have with your health care provider. ?Document Revised: 04/17/2021 Document Reviewed: 01/13/2021 ?Elsevier Patient Education ? [place]. ?Hypertension, Adult ?Hypertension is another name for high blood pressure. High blood pressure forces your heart to work harder to pump blood. This can cause problems over time. ?There are two numbers in a blood pressure reading. There is a top number (systolic) over a bottom number (diastolic). It is best to have a blood pressure that is below 120/80. ?What are the causes? ?The cause of this condition is not known. Some other conditions can lead to high blood pressure. ?What increases the risk? ?Some lifestyle factors can make you more likely to develop high blood pressure: ?Smoking. ?Not getting enough exercise or physical activity. ?Being  overweight. ?Having too much fat, sugar, calories, or salt (sodium) in your diet. ?Drinking too much alcohol. ?Other risk factors include: ?Having any of these conditions: ?Heart disease. ?Diabetes. ?High cholesterol. ?[place]

## 2022-02-14 NOTE — Assessment & Plan Note (Signed)
Anxiety well controlled on current medication, symptoms are improving.  ?

## 2022-02-15 LAB — COMPREHENSIVE METABOLIC PANEL
ALT: 26 IU/L (ref 0–32)
AST: 26 IU/L (ref 0–40)
Albumin/Globulin Ratio: 1.2 (ref 1.2–2.2)
Albumin: 4.4 g/dL (ref 3.8–4.8)
Alkaline Phosphatase: 98 IU/L (ref 44–121)
BUN/Creatinine Ratio: 14 (ref 9–23)
BUN: 11 mg/dL (ref 6–20)
Bilirubin Total: 0.3 mg/dL (ref 0.0–1.2)
CO2: 23 mmol/L (ref 20–29)
Calcium: 9.4 mg/dL (ref 8.7–10.2)
Chloride: 98 mmol/L (ref 96–106)
Creatinine, Ser: 0.76 mg/dL (ref 0.57–1.00)
Globulin, Total: 3.6 g/dL (ref 1.5–4.5)
Glucose: 102 mg/dL — ABNORMAL HIGH (ref 70–99)
Potassium: 4.6 mmol/L (ref 3.5–5.2)
Sodium: 134 mmol/L (ref 134–144)
Total Protein: 8 g/dL (ref 6.0–8.5)
eGFR: 105 mL/min/{1.73_m2} (ref >59)

## 2022-02-15 LAB — ABO/RH: Rh Factor: POSITIVE

## 2022-02-15 LAB — CBC WITH DIFFERENTIAL/PLATELET
Basophils Absolute: 0 10*3/uL (ref 0.0–0.2)
Basos: 1 %
EOS (ABSOLUTE): 0.1 10*3/uL (ref 0.0–0.4)
Eos: 1 %
Hematocrit: 36 % (ref 34.0–46.6)
Hemoglobin: 12.3 g/dL (ref 11.1–15.9)
Immature Grans (Abs): 0 10*3/uL (ref 0.0–0.1)
Immature Granulocytes: 0 %
Lymphocytes Absolute: 1.6 10*3/uL (ref 0.7–3.1)
Lymphs: 27 %
MCH: 27.4 pg (ref 26.6–33.0)
MCHC: 34.2 g/dL (ref 31.5–35.7)
MCV: 80 fL (ref 79–97)
Monocytes Absolute: 0.6 10*3/uL (ref 0.1–0.9)
Monocytes: 10 %
Neutrophils Absolute: 3.6 10*3/uL (ref 1.4–7.0)
Neutrophils: 61 %
Platelets: 325 10*3/uL (ref 150–450)
RBC: 4.49 x10E6/uL (ref 3.77–5.28)
RDW: 12.5 % (ref 11.7–15.4)
WBC: 5.9 10*3/uL (ref 3.4–10.8)

## 2022-02-15 LAB — LIPID PANEL
Chol/HDL Ratio: 7.1 ratio — ABNORMAL HIGH (ref 0.0–4.4)
Cholesterol, Total: 207 mg/dL — ABNORMAL HIGH (ref 100–199)
HDL: 29 mg/dL — ABNORMAL LOW (ref >39)
LDL Chol Calc (NIH): 160 mg/dL — ABNORMAL HIGH (ref 0–99)
Triglycerides: 98 mg/dL (ref 0–149)
VLDL Cholesterol Cal: 18 mg/dL (ref 5–40)

## 2022-02-18 ENCOUNTER — Encounter: Payer: Self-pay | Admitting: Obstetrics

## 2022-02-18 ENCOUNTER — Telehealth (INDEPENDENT_AMBULATORY_CARE_PROVIDER_SITE_OTHER): Payer: BC Managed Care – PPO | Admitting: Obstetrics

## 2022-02-18 DIAGNOSIS — T8332XA Displacement of intrauterine contraceptive device, initial encounter: Secondary | ICD-10-CM

## 2022-02-18 NOTE — Progress Notes (Signed)
? ? ?GYNECOLOGY VIRTUAL VISIT ENCOUNTER NOTE ? ?Provider location: [facility] [place]  ? ?Patient location: Home ? ?I connected with Alexis Donaldson on 02/18/22 at  1:50 PM EDT by MyChart Video Encounter and verified that I am speaking with the correct person using two identifiers. ?  ?I discussed the limitations, risks, security and privacy concerns of performing an evaluation and management service virtually and the availability of in person appointments. I also discussed with the patient that there may be a patient responsible charge related to this service. The patient expressed understanding and agreed to proceed. ?  ?History:  ?Alexis Donaldson is a 36 y.o. [place] female being evaluated today for ultrasound results for IUD position because strings not visible on exam. She denies any abnormal vaginal discharge, bleeding, pelvic pain or other concerns.   ?  ?  ?Past Medical History:  ?Diagnosis Date  ? Allergy   ? Anxiety   ? Asthma   ? Cervical dysplasia   ? Depression   ? HSV-1 (herpes simplex virus 1) infection   ? HSV-2 (herpes simplex virus 2) infection   ? Hypertension   ? STD (sexually transmitted disease)   ? Chlamydia X2,HSV  ? ?Past Surgical History:  ?Procedure Laterality Date  ? CERVICAL BIOPSY    ? COLPOSCOPY    ? GYNECOLOGIC CRYOSURGERY    ? Mirena    ? Inserted 04/07/2018  ? WISDOM TOOTH EXTRACTION    ? ?The following portions of the patient's history were reviewed and updated as appropriate: allergies, current medications, past family history, past medical history, past social history, past surgical history and problem list.  ? ?Health Maintenance:  Normal pap and negative HRHPV on 01-28-2022.    ? ?Review of Systems:  ?Pertinent items noted in HPI and remainder of comprehensive ROS otherwise negative. ? ?Physical Exam:  ? ?General:  Alert, oriented and cooperative. Patient appears to be in no acute distress.  ?Mental Status: Normal mood and affect. Normal behavior. Normal judgment  and thought content.   ?Respiratory: Normal respiratory effort, no problems with respiration noted  ?Rest of physical exam deferred due to type of encounter ? ?Labs and Imaging ?Results for orders placed or performed in visit on 02/14/22 (from the past 336 hour(s))  ?CBC with Differential  ? Collection Time: 02/14/22  9:32 AM  ?Result Value Ref Range  ? WBC 5.9 3.4 - 10.8 x10E3/uL  ? RBC 4.49 3.77 - 5.28 x10E6/uL  ? Hemoglobin 12.3 11.1 - 15.9 g/dL  ? Hematocrit 36.0 34.0 - 46.6 %  ? MCV 80 79 - 97 fL  ? MCH 27.4 26.6 - 33.0 pg  ? MCHC 34.2 31.5 - 35.7 g/dL  ? RDW 12.5 11.7 - 15.4 %  ? Platelets 325 150 - 450 x10E3/uL  ? Neutrophils 61 Not Estab. %  ? Lymphs 27 Not Estab. %  ? Monocytes 10 Not Estab. %  ? Eos 1 Not Estab. %  ? Basos 1 Not Estab. %  ? Neutrophils Absolute 3.6 1.4 - 7.0 x10E3/uL  ? Lymphocytes Absolute 1.6 0.7 - 3.1 x10E3/uL  ? Monocytes Absolute 0.6 0.1 - 0.9 x10E3/uL  ? EOS (ABSOLUTE) 0.1 0.0 - 0.4 x10E3/uL  ? Basophils Absolute 0.0 0.0 - 0.2 x10E3/uL  ? Immature Granulocytes 0 Not Estab. %  ? Immature Grans (Abs) 0.0 0.0 - 0.1 x10E3/uL  ?Comprehensive metabolic panel  ? Collection Time: 02/14/22  9:32 AM  ?Result Value Ref Range  ? Glucose 102 (H) 70 -  99 mg/dL  ? BUN 11 6 - 20 mg/dL  ? Creatinine, Ser 0.76 0.57 - 1.00 mg/dL  ? eGFR 105 >59 mL/min/1.73  ? BUN/Creatinine Ratio 14 9 - 23  ? Sodium 134 134 - 144 mmol/L  ? Potassium 4.6 3.5 - 5.2 mmol/L  ? Chloride 98 96 - 106 mmol/L  ? CO2 23 20 - 29 mmol/L  ? Calcium 9.4 8.7 - 10.2 mg/dL  ? Total Protein 8.0 6.0 - 8.5 g/dL  ? Albumin 4.4 3.8 - 4.8 g/dL  ? Globulin, Total 3.6 1.5 - 4.5 g/dL  ? Albumin/Globulin Ratio 1.2 1.2 - 2.2  ? Bilirubin Total 0.3 0.0 - 1.2 mg/dL  ? Alkaline Phosphatase 98 44 - 121 IU/L  ? AST 26 0 - 40 IU/L  ? ALT 26 0 - 32 IU/L  ?Lipid Panel  ? Collection Time: 02/14/22  9:32 AM  ?Result Value Ref Range  ? Cholesterol, Total 207 (H) 100 - 199 mg/dL  ? Triglycerides 98 0 - 149 mg/dL  ? HDL 29 (L) >39 mg/dL  ? VLDL Cholesterol  Cal 18 5 - 40 mg/dL  ? LDL Chol Calc (NIH) 160 (H) 0 - 99 mg/dL  ? Chol/HDL Ratio 7.1 (H) 0.0 - 4.4 ratio  ?ABO/Rh  ? Collection Time: 02/14/22  9:32 AM  ?Result Value Ref Range  ? ABO Grouping O   ? Rh Factor Positive   ? ?US PELVIC COMPLETE WITH TRANSVAGINAL ? ?Result Date: 02/03/2022 ?CLINICAL DATA:  Pelvic pain, pain left lower quadrant, evaluate IUD position EXAM: TRANSABDOMINAL AND TRANSVAGINAL ULTRASOUND OF PELVIS TECHNIQUE: Both transabdominal and transvaginal ultrasound examinations of the pelvis were performed. Transabdominal technique was performed for global imaging of the pelvis including uterus, ovaries, adnexal regions, and pelvic cul-de-sac. It was necessary to proceed with endovaginal exam following the transabdominal exam to visualize the endometrium and ovaries. COMPARISON:  None FINDINGS: Uterus Measurements: 8.1 x 3.7 x 4.5 cm = volume: 69.3 ML. There is 1 x 0.5 x 1.1 cm fibroid in the right side of fundus. Endometrium Thickness: 7.1 mm. IUD is noted in the usual position. Trace amount of fluid is seen in the endometrial cavity. Right ovary Measurements: 2 x 2 x 1.8 cm = volume: 3.8 mL. Normal appearance/no adnexal mass. Left ovary Measurements: 3.4 x 1.9 x 3.3 cm = volume: 11.2 mL. There is 2.1 x 1.8 x 2 cm complex cyst in the left adnexa, possibly hemorrhagic cyst/follicle. Color Doppler examination shows vascular flow in both adnexal regions. Other findings No abnormal free fluid. IMPRESSION: IUD is noted in the endometrium in the usual position. There is 2.1 cm complex cyst in the left adnexa, possibly hemorrhagic cyst/follicle. There is 1.1 cm fibroid in the right side of fundus. Electronically Signed   By: Elmer Picker M.D.   On: 02/03/2022 15:36     ?  ?Assessment and Plan:  ?   ?1. Intrauterine contraceptive device threads lost, initial encounter ?- ultrasound reveals the IUD is in a normal intrauterine position  ?- ultrasound findings discussed and all questions answered to  patient's satisfaction ?   ?  ?I discussed the assessment and treatment plan with the patient. The patient was provided an opportunity to ask questions and all were answered. The patient agreed with the plan and demonstrated an understanding of the instructions. ?  ?The patient was advised to call back or seek an in-person evaluation/go to the ED if the symptoms worsen or if the condition fails to improve as anticipated. ? ?  I have spent a total of 15 minutes of non-face-to-face time, excluding clinical staff time, reviewing notes and preparing to see patient, ordering tests and/or medications, and counseling the patient.  ? ? ?Baltazar Najjar, MD ?[facility] [place], [place], [facility] ?02/18/22  ?

## 2022-02-18 NOTE — Progress Notes (Signed)
Patient presents for My chart follow up for IUD [place]. US shows IUD in optimal location ?

## 2022-02-21 ENCOUNTER — Encounter: Payer: Self-pay | Admitting: Nurse Practitioner

## 2022-02-21 ENCOUNTER — Telehealth: Payer: Self-pay

## 2022-02-21 NOTE — Telephone Encounter (Signed)
Pt has been notified of results.

## 2022-03-04 ENCOUNTER — Ambulatory Visit: Payer: BC Managed Care – PPO | Admitting: Dietician

## 2022-03-04 NOTE — Progress Notes (Signed)
Ht:       63"        Wt:      222# BMI:    39.3 Labs:   (03/04/22) chol @ 2047 and increased since last review, TG @ 98 and greatly improved since last review, HDL 29 (below recommendations) decreased since last review, HDL @ 160 and increased since last review MVI:     None Dx/Presenting Problem:       Desires Weight Loss, Obesity, HTN Living Conditions/Eco-Social Assessment:  Adequate stove, refrigerator and water source (city water) Language:  English Time:   3:15pm                                         Duration:  30 minutes (CPT Code [phone]) MNT Provider in [place]:   Not WIC eligible Medical Order for MNT Service:  Self-referral     Diagnostic Nutrition Assessment:  36yo female presents today for a follow-up appointment.  Overall, she feels an improvement in her intake and she feels that she listens more to her hunger/satiety cues.  She reports feeling a little discouraged after receiving her most recent lab results.  She notes that her PCP wants her on a cholesterol lowering/carbohydrate-controlled diet.  Her lifestyle remains busy.  She has continued her workouts at the gym she routinely goes for 3 days on most weeks.  She has started devoting 2-3 days/week to cardio.  She is still using Carb Manager, which tracks all of the macronutrients and fiber.  She is eating 1200-2000kcals/day with 20-40 grams of fiber/day.  She continues started limiting added sugars.  She continues with healthy snacking when she is up late.  She has cut back on the Atkins snacks.  She also eats a lot of cheese, but has incorporated low-fat/fat-free cheese.  Protein sources include chicken (without skin), [place], tuna and salmon.  She is trying not to skip meals, but will still occasionally skip breakfast.  Will reiterate education highlighting portion control, decreasing fat/sugar intake, increasing physical activity and interpreting food labels in order to help promote more of a decrease in daily energy  intake in order to promote continued, gradual, healthy weight loss.  Will also encourage viscous fiber intake at 5-10 grams/day, limiting starchy foods/refined carbohydrates/sugar, increase nut intake and increase intake of plant sterols/stanols.     Diagnosis:   Presents with excessive fat intake (N1-5.6.2) and excessive energy intake (N1-1.5) r/t poor portion control as evidenced by overweight/obesity with BMI >/+40 ([place]-3.3).   Intervention/Nutrition Care Plan/Counseling:   General recommendations were to (1) continue with increased whole grain/fiber intake to promote satiety, (2) continue to include fiber and protein with snacks, (3) limit total fat and saturated fat intake, (4) increase physical activity especially cardio, (5) limit portions, (6) continue reading food labels, and (7) increase fruit/vegetable intake to 9 servings daily.  Praised for gradual weight decrease and dramatic decrease in triglycerides.  We will continue with the goal of 10-15% total weight loss to promote an improvement in lab values.     Monitoring/Evaluation:   Will follow-up with a weight check in 1 month (04/10/22 @ 9am).

## 2022-03-24 NOTE — Telephone Encounter (Signed)
That is possible if you want to repeat your labs in 3 months, call and make an appoimtment and let them know you are coming in to recheck labs. I will put in orders after your OV. thanks

## 2022-04-02 ENCOUNTER — Ambulatory Visit: Payer: BC Managed Care – PPO | Admitting: Dietician

## 2022-04-02 NOTE — Progress Notes (Signed)
Assessment: Ht:       63"        Wt:      216# BMI:    38.2 Labs:   (03/04/22) chol @ 207 and increased since last draw, TG @ 98 and greatly improved since last draw, HDL 29 (below recommendations) decreased since last draw, LDL @ 160 and increased since last draw; no new labs since last visit MVI:     None Dx/Presenting Problem:       Desires Weight Loss, Obesity, HTN Living Conditions/Eco-Social Assessment:  Adequate stove, refrigerator and water source (city water) Language:  English Time:   2pm                                         Duration:  30 minutes (CPT Code [phone]) MNT Provider in [place]:   Not WIC eligible Medical Order for MNT Service:  Self-referral     Diagnostic Nutrition Assessment:  36yo female presents today for a follow-up appointment.  Presents with continued weight loss and an overall weight loss of 6.9% since her initial visit (goal is 10-15% of total body weight).  Alexis Donaldson continues to be mindful of her hunger/satiety cues.  There have been no new labs since her last draw.  She says that her PCP wants to draw labs in six months, but she has requested a draw in three months.  Overall, she feels that she has been successful with implementing the cholesterol lowering/carbohydrate-controlled diet we discussed at her last visit.  Her lifestyle remains busy, and now she reports being busier at work due to a staffing deficit.  She continues her workouts at the gym and has increased to 5 days/week.  She has done this consistently X 2 weeks so far.  She devotes an entire day to cardio workouts and at least 20 minutes of her workouts on the other days.  Encouraged increasing to a minimum of 30 minutes (or to tolerance if 30 minutes is too much) of cardio per general recommendations for heart health.  She is still trying to walk but has not been successful with dedicating time to it.  She uses a fitness tracker ring and is averaging 6,500 steps/day.  Reviewed the general  recommendations for 10,000 steps/day for heart health and this is a goal for her.  She is still using Carb Manager, which tracks all of the macronutrients and fiber.  She is eating 1500-1800kcals/day with 25-28 grams of fiber/day.  She is choosing more foods with viscous fiber.  She continues limiting added sugars.  She says she does eat as many Atkins snacks as before (she lost the taste for it).  She has also cut back on cheese and she will choose lower fat cheeses when she does eat it.  Her water intake goal is a gallon/day.  She reports "lazy" meal prepping this week.  She visited [employer] and ordered the family portion of grilled meats and vegetables, and divided it into 7 days' worth of lunches.  She also prepared cucumber salad, quinoa and extra vegetables to go with the extra grilled meats for the last couple days of the week.  Will continue with previous recommendations.     Diagnosis:   Presents with excessive fat intake (N1-5.6.2) and excessive energy intake (N1-1.5) r/t poor portion control as evidenced by overweight/obesity with BMI >/+40 ([place]-3.3).   Intervention/Nutrition Care Plan/Counseling:  General recommendations were to (1) continue with increased whole grain/fiber intake to promote satiety, (2) continue to include fiber and protein with snacks, (3) limit total fat and saturated fat intake, (4) increase physical activity especially cardio, (5) limit portions, (6) continue reading food labels, and (7) increase fruit/vegetable intake to 9 servings daily.  Praised for gradual weight decrease and dramatic decrease in triglycerides.  We will continue with the goal of 10-15% total weight loss to promote an improvement in lab values.     Monitoring/Evaluation:   Will follow-up as needed.  Client has changed insurance carriers and is unsure if RD visits are covered.  She will reach out once she has more information.

## 2022-04-10 ENCOUNTER — Ambulatory Visit: Payer: BC Managed Care – PPO | Admitting: Dietician

## 2022-08-18 ENCOUNTER — Ambulatory Visit: Payer: BC Managed Care – PPO | Admitting: Nurse Practitioner
